# Patient Record
Sex: Female | Born: 1965 | Race: White | Hispanic: No | Marital: Single | Smoking: Current some day smoker
Health system: Southern US, Community
[De-identification: ages and names within clinical notes are randomized; demographics above are authoritative.]

## PROBLEM LIST (undated history)

## (undated) DIAGNOSIS — F419 Anxiety disorder, unspecified: Secondary | ICD-10-CM

## (undated) DIAGNOSIS — H269 Unspecified cataract: Secondary | ICD-10-CM

## (undated) DIAGNOSIS — G4700 Insomnia, unspecified: Secondary | ICD-10-CM

## (undated) DIAGNOSIS — N200 Calculus of kidney: Secondary | ICD-10-CM

## (undated) HISTORY — PX: TUBAL LIGATION: SHX77

## (undated) HISTORY — DX: Insomnia, unspecified: G47.00

## (undated) HISTORY — DX: Calculus of kidney: N20.0

## (undated) HISTORY — DX: Anxiety disorder, unspecified: F41.9

## (undated) HISTORY — DX: Unspecified cataract: H26.9

---

## 1997-09-04 ENCOUNTER — Emergency Department (HOSPITAL_COMMUNITY): Admission: EM | Admit: 1997-09-04 | Discharge: 1997-09-04 | Payer: Self-pay | Admitting: Emergency Medicine

## 2007-02-16 ENCOUNTER — Ambulatory Visit: Payer: Self-pay | Admitting: Gynecology

## 2007-02-16 ENCOUNTER — Encounter (INDEPENDENT_AMBULATORY_CARE_PROVIDER_SITE_OTHER): Payer: Self-pay | Admitting: Gynecology

## 2012-07-01 ENCOUNTER — Encounter: Payer: Self-pay | Admitting: Obstetrics and Gynecology

## 2012-07-01 ENCOUNTER — Ambulatory Visit (INDEPENDENT_AMBULATORY_CARE_PROVIDER_SITE_OTHER): Payer: Self-pay | Admitting: Obstetrics and Gynecology

## 2012-07-01 VITALS — BP 105/63 | HR 79 | Ht 68.0 in | Wt 154.0 lb

## 2012-07-01 DIAGNOSIS — Z01419 Encounter for gynecological examination (general) (routine) without abnormal findings: Secondary | ICD-10-CM

## 2012-07-01 DIAGNOSIS — Z124 Encounter for screening for malignant neoplasm of cervix: Secondary | ICD-10-CM

## 2012-07-01 MED ORDER — ZOLPIDEM TARTRATE 10 MG PO TABS: 5.0000 mg | ORAL_TABLET | Freq: Every evening | ORAL | Status: DC | PRN

## 2012-07-01 NOTE — Progress Notes (Signed)
  Subjective:     Brittney Howell is a 47 y.o. female G3P3 postmenopausal x 5 years with BMi 23 who is here for a comprehensive physical exam. The patient reports problems related with anxiety and insomina. Patient admits to being under a lot of stress lately. Was taking melatonin but it it no longer helping as a sleeping agent.  History   Social History  . Marital Status: Single    Spouse Name: N/A    Number of Children: N/A  . Years of Education: N/A   Occupational History  . Not on file.   Social History Main Topics  . Smoking status: Current Every Day Smoker -- 0.50 packs/day    Types: Cigarettes  . Smokeless tobacco: Not on file  . Alcohol Use: 0.5 oz/week    1 drink(s) per week  . Drug Use: No  . Sexually Active: Yes   Other Topics Concern  . Not on file   Social History Narrative  . No narrative on file   Health Maintenance  Topic Date Due  . Influenza Vaccine  12/29/1963  . Pap Smear  06/03/1981  . Tetanus/tdap  06/03/1982       Review of Systems A comprehensive review of systems was negative.   Objective:      GENERAL: Well-developed, well-nourished female in no acute distress.  HEENT: Normocephalic, atraumatic. Sclerae anicteric.  NECK: Supple. Normal thyroid.  LUNGS: Clear to auscultation bilaterally.  HEART: Regular rate and rhythm. BREASTS: Symmetric in size. No palpable masses or lymphadenopathy, skin changes, or nipple drainage. ABDOMEN: Soft, nontender, nondistended. No organomegaly. PELVIC: Normal external female genitalia. Vagina is pink and rugated.  Normal discharge. Normal appearing cervix. Uterus is normal in size. No adnexal mass or tenderness. EXTREMITIES: No cyanosis, clubbing, or edema, 2+ distal pulses.    Assessment:    Healthy female exam.      Plan:    Pap smear collected Referral for mammogram provided Ambien for sleep Referral to behavioral health for anxiety work up Patient advised to continue monthly breast and vulva  exam See After Visit Summary for Counseling Recommendations

## 2012-07-01 NOTE — Progress Notes (Signed)
Here today for yearly gyn physical.  Having trouble sleeping, is having panic attacks at least once a week x 2 months.  Patient is having stress in relationship and with her mom going thru divorce.

## 2012-07-01 NOTE — Patient Instructions (Signed)
Preventive Care for Adults, Female A healthy lifestyle and preventive care can promote health and wellness. Preventive health guidelines for women include the following key practices.  A routine yearly physical is a good way to check with your caregiver about your health and preventive screening. It is a chance to share any concerns and updates on your health, and to receive a thorough exam.  Visit your dentist for a routine exam and preventive care every 6 months. Brush your teeth twice a day and floss once a day. Good oral hygiene prevents tooth decay and gum disease.  The frequency of eye exams is based on your age, health, family medical history, use of contact lenses, and other factors. Follow your caregiver's recommendations for frequency of eye exams.  Eat a healthy diet. Foods like vegetables, fruits, whole grains, low-fat dairy products, and lean protein foods contain the nutrients you need without too many calories. Decrease your intake of foods high in solid fats, added sugars, and salt. Eat the right amount of calories for you.Get information about a proper diet from your caregiver, if necessary.  Regular physical exercise is one of the most important things you can do for your health. Most adults should get at least 150 minutes of moderate-intensity exercise (any activity that increases your heart rate and causes you to sweat) each week. In addition, most adults need muscle-strengthening exercises on 2 or more days a week.  Maintain a healthy weight. The body mass index (BMI) is a screening tool to identify possible weight problems. It provides an estimate of body fat based on height and weight. Your caregiver can help determine your BMI, and can help you achieve or maintain a healthy weight.For adults 20 years and older:  A BMI below 18.5 is considered underweight.  A BMI of 18.5 to 24.9 is normal.  A BMI of 25 to 29.9 is considered overweight.  A BMI of 30 and above is  considered obese.  Maintain normal blood lipids and cholesterol levels by exercising and minimizing your intake of saturated fat. Eat a balanced diet with plenty of fruit and vegetables. Blood tests for lipids and cholesterol should begin at age 20 and be repeated every 5 years. If your lipid or cholesterol levels are high, you are over 50, or you are at high risk for heart disease, you may need your cholesterol levels checked more frequently.Ongoing high lipid and cholesterol levels should be treated with medicines if diet and exercise are not effective.  If you smoke, find out from your caregiver how to quit. If you do not use tobacco, do not start.  If you are pregnant, do not drink alcohol. If you are breastfeeding, be very cautious about drinking alcohol. If you are not pregnant and choose to drink alcohol, do not exceed 1 drink per day. One drink is considered to be 12 ounces (355 mL) of beer, 5 ounces (148 mL) of wine, or 1.5 ounces (44 mL) of liquor.  Avoid use of street drugs. Do not share needles with anyone. Ask for help if you need support or instructions about stopping the use of drugs.  High blood pressure causes heart disease and increases the risk of stroke. Your blood pressure should be checked at least every 1 to 2 years. Ongoing high blood pressure should be treated with medicines if weight loss and exercise are not effective.  If you are 55 to 49 years old, ask your caregiver if you should take aspirin to prevent strokes.  Diabetes   screening involves taking a blood sample to check your fasting blood sugar level. This should be done once every 3 years, after age 45, if you are within normal weight and without risk factors for diabetes. Testing should be considered at a younger age or be carried out more frequently if you are overweight and have at least 1 risk factor for diabetes.  Breast cancer screening is essential preventive care for women. You should practice "breast  self-awareness." This means understanding the normal appearance and feel of your breasts and may include breast self-examination. Any changes detected, no matter how small, should be reported to a caregiver. Women in their 20s and 30s should have a clinical breast exam (CBE) by a caregiver as part of a regular health exam every 1 to 3 years. After age 40, women should have a CBE every year. Starting at age 40, women should consider having a mammography (breast X-ray test) every year. Women who have a family history of breast cancer should talk to their caregiver about genetic screening. Women at a high risk of breast cancer should talk to their caregivers about having magnetic resonance imaging (MRI) and a mammography every year.  The Pap test is a screening test for cervical cancer. A Pap test can show cell changes on the cervix that might become cervical cancer if left untreated. A Pap test is a procedure in which cells are obtained and examined from the lower end of the uterus (cervix).  Women should have a Pap test starting at age 21.  Between ages 21 and 29, Pap tests should be repeated every 2 years.  Beginning at age 30, you should have a Pap test every 3 years as long as the past 3 Pap tests have been normal.  Some women have medical problems that increase the chance of getting cervical cancer. Talk to your caregiver about these problems. It is especially important to talk to your caregiver if a new problem develops soon after your last Pap test. In these cases, your caregiver may recommend more frequent screening and Pap tests.  The above recommendations are the same for women who have or have not gotten the vaccine for human papillomavirus (HPV).  If you had a hysterectomy for a problem that was not cancer or a condition that could lead to cancer, then you no longer need Pap tests. Even if you no longer need a Pap test, a regular exam is a good idea to make sure no other problems are  starting.  If you are between ages 65 and 70, and you have had normal Pap tests going back 10 years, you no longer need Pap tests. Even if you no longer need a Pap test, a regular exam is a good idea to make sure no other problems are starting.  If you have had past treatment for cervical cancer or a condition that could lead to cancer, you need Pap tests and screening for cancer for at least 20 years after your treatment.  If Pap tests have been discontinued, risk factors (such as a new sexual partner) need to be reassessed to determine if screening should be resumed.  The HPV test is an additional test that may be used for cervical cancer screening. The HPV test looks for the virus that can cause the cell changes on the cervix. The cells collected during the Pap test can be tested for HPV. The HPV test could be used to screen women aged 30 years and older, and should   be used in women of any age who have unclear Pap test results. After the age of 30, women should have HPV testing at the same frequency as a Pap test.  Colorectal cancer can be detected and often prevented. Most routine colorectal cancer screening begins at the age of 50 and continues through age 75. However, your caregiver may recommend screening at an earlier age if you have risk factors for colon cancer. On a yearly basis, your caregiver may provide home test kits to check for hidden blood in the stool. Use of a small camera at the end of a tube, to directly examine the colon (sigmoidoscopy or colonoscopy), can detect the earliest forms of colorectal cancer. Talk to your caregiver about this at age 50, when routine screening begins. Direct examination of the colon should be repeated every 5 to 10 years through age 75, unless early forms of pre-cancerous polyps or small growths are found.  Hepatitis C blood testing is recommended for all people born from 1945 through 1965 and any individual with known risks for hepatitis C.  Practice  safe sex. Use condoms and avoid high-risk sexual practices to reduce the spread of sexually transmitted infections (STIs). STIs include gonorrhea, chlamydia, syphilis, trichomonas, herpes, HPV, and human immunodeficiency virus (HIV). Herpes, HIV, and HPV are viral illnesses that have no cure. They can result in disability, cancer, and death. Sexually active women aged 25 and younger should be checked for chlamydia. Older women with new or multiple partners should also be tested for chlamydia. Testing for other STIs is recommended if you are sexually active and at increased risk.  Osteoporosis is a disease in which the bones lose minerals and strength with aging. This can result in serious bone fractures. The risk of osteoporosis can be identified using a bone density scan. Women ages 65 and over and women at risk for fractures or osteoporosis should discuss screening with their caregivers. Ask your caregiver whether you should take a calcium supplement or vitamin D to reduce the rate of osteoporosis.  Menopause can be associated with physical symptoms and risks. Hormone replacement therapy is available to decrease symptoms and risks. You should talk to your caregiver about whether hormone replacement therapy is right for you.  Use sunscreen with sun protection factor (SPF) of 30 or more. Apply sunscreen liberally and repeatedly throughout the day. You should seek shade when your shadow is shorter than you. Protect yourself by wearing long sleeves, pants, a wide-brimmed hat, and sunglasses year round, whenever you are outdoors.  Once a month, do a whole body skin exam, using a mirror to look at the skin on your back. Notify your caregiver of new moles, moles that have irregular borders, moles that are larger than a pencil eraser, or moles that have changed in shape or color.  Stay current with required immunizations.  Influenza. You need a dose every fall (or winter). The composition of the flu vaccine  changes each year, so being vaccinated once is not enough.  Pneumococcal polysaccharide. You need 1 to 2 doses if you smoke cigarettes or if you have certain chronic medical conditions. You need 1 dose at age 65 (or older) if you have never been vaccinated.  Tetanus, diphtheria, pertussis (Tdap, Td). Get 1 dose of Tdap vaccine if you are younger than age 65, are over 65 and have contact with an infant, are a healthcare worker, are pregnant, or simply want to be protected from whooping cough. After that, you need a Td   booster dose every 10 years. Consult your caregiver if you have not had at least 3 tetanus and diphtheria-containing shots sometime in your life or have a deep or dirty wound.  HPV. You need this vaccine if you are a woman age 26 or younger. The vaccine is given in 3 doses over 6 months.  Measles, mumps, rubella (MMR). You need at least 1 dose of MMR if you were born in 1957 or later. You may also need a second dose.  Meningococcal. If you are age 19 to 21 and a first-year college student living in a residence hall, or have one of several medical conditions, you need to get vaccinated against meningococcal disease. You may also need additional booster doses.  Zoster (shingles). If you are age 60 or older, you should get this vaccine.  Varicella (chickenpox). If you have never had chickenpox or you were vaccinated but received only 1 dose, talk to your caregiver to find out if you need this vaccine.  Hepatitis A. You need this vaccine if you have a specific risk factor for hepatitis A virus infection or you simply wish to be protected from this disease. The vaccine is usually given as 2 doses, 6 to 18 months apart.  Hepatitis B. You need this vaccine if you have a specific risk factor for hepatitis B virus infection or you simply wish to be protected from this disease. The vaccine is given in 3 doses, usually over 6 months. Preventive Services / Frequency Ages 40 to 64  Blood  pressure check.** / Every 1 to 2 years.  Lipid and cholesterol check.** / Every 5 years beginning at age 20.  Clinical breast exam.** / Every year after age 40.  Mammogram.** / Every year beginning at age 40 and continuing for as long as you are in good health. Consult with your caregiver.  Pap test.** / Every 3 years starting at age 30 through age 65 or 70 with a history of 3 consecutive normal Pap tests.  HPV screening.** / Every 3 years from ages 30 through ages 65 to 70 with a history of 3 consecutive normal Pap tests.  Fecal occult blood test (FOBT) of stool. / Every year beginning at age 50 and continuing until age 75. You may not need to do this test if you get a colonoscopy every 10 years.  Flexible sigmoidoscopy or colonoscopy.** / Every 5 years for a flexible sigmoidoscopy or every 10 years for a colonoscopy beginning at age 50 and continuing until age 75.  Hepatitis C blood test.** / For all people born from 1945 through 1965 and any individual with known risks for hepatitis C.  Skin self-exam. / Monthly.  Influenza immunization.** / Every year.  Pneumococcal polysaccharide immunization.** / 1 to 2 doses if you smoke cigarettes or if you have certain chronic medical conditions.  Tetanus, diphtheria, pertussis (Tdap, Td) immunization.** / A one-time dose of Tdap vaccine. After that, you need a Td booster dose every 10 years.  Measles, mumps, rubella (MMR) immunization. / You need at least 1 dose of MMR if you were born in 1957 or later. You may also need a second dose.  Varicella immunization.** / Consult your caregiver.  Meningococcal immunization.** / Consult your caregiver.  Hepatitis A immunization.** / Consult your caregiver. 2 doses, 6 to 18 months apart.  Hepatitis B immunization.** / Consult your caregiver. 3 doses, usually over 6 months. ** Family history and personal history of risk and conditions may change your caregiver's recommendations.   Document Released:  06/11/2001 Document Revised: 07/08/2011 Document Reviewed: 09/10/2010 [employer] Patient Information 2013 [employer].  

## 2012-09-04 ENCOUNTER — Other Ambulatory Visit: Payer: Self-pay | Admitting: Obstetrics and Gynecology

## 2012-09-04 DIAGNOSIS — Z1231 Encounter for screening mammogram for malignant neoplasm of breast: Secondary | ICD-10-CM

## 2012-09-11 ENCOUNTER — Ambulatory Visit (HOSPITAL_COMMUNITY)
Admission: RE | Admit: 2012-09-11 | Discharge: 2012-09-11 | Disposition: A | Discharge status: Home / Self Care | Payer: Self-pay | Source: Ambulatory Visit | Attending: Obstetrics and Gynecology | Admitting: Obstetrics and Gynecology

## 2012-09-11 DIAGNOSIS — Z1231 Encounter for screening mammogram for malignant neoplasm of breast: Secondary | ICD-10-CM

## 2013-02-11 ENCOUNTER — Telehealth: Payer: Self-pay

## 2013-02-11 NOTE — Telephone Encounter (Signed)
Dr. Shawnie Pons this patient called wanting a refill on her Ambien she is out. She said you told her to call the office if she needed more. Please advice what to do,thanks! I don't think this med can be sent electronically, so let us know. Thanks

## 2013-02-12 ENCOUNTER — Telehealth: Payer: Self-pay

## 2013-02-12 NOTE — Telephone Encounter (Deleted)
Hi Dr. Shawnie Pons,  This patient has seen you before but on her last visit we had to put her with Dr.Cosntant because you were full.

## 2013-02-24 NOTE — Telephone Encounter (Signed)
Patient called requesting medication refill.  Spoke to Dr. Shawnie Pons. Meds authorized.

## 2013-05-13 ENCOUNTER — Telehealth: Payer: Self-pay | Admitting: *Deleted

## 2013-05-13 HISTORY — PX: KNEE SURGERY: SHX244

## 2013-05-13 NOTE — Telephone Encounter (Signed)
I do not see where we have ever given this to her.  Have I ever seen her?

## 2013-05-13 NOTE — Telephone Encounter (Signed)
Patient is calling to request a refill of her xanax.  She is just leaving the hospital at [facility] after falling and breaking her tibia and her left hand.  She is calling me from the hospital. She is not due for her physical until March.

## 2013-05-14 ENCOUNTER — Telehealth: Payer: Self-pay | Admitting: *Deleted

## 2013-05-14 NOTE — Telephone Encounter (Signed)
The pharmacist says that it was called in on 02-12-13 with two refills of #30.  Xanax 0.5mg .  She did fill the rx on the 02-12-13, 03-09-13, and 04-10-13. Dates.  She said that she has been taking it once a day at night to help calm her down enough to rest.  I can request her original chart from storage if you need me to.  I am wondering if her notes are not in the system due to the issue we had for those several months before epic began where we were not automatically receiving the dictations and had to call to have them faxed to [place].  I have seen this come up a couple of times in patients electronic charts.  There is a note on the 02-12-13 day regarding Ambien but nothing mentioning xanax.  Just let me know what you would like for me to do.

## 2013-05-14 NOTE — Telephone Encounter (Signed)
You saw her up until this last visit, when we were on paper charts.  She usually just gets it a couple times a year, not very often.  She is one that has been coming since we were in [place].  I think it might have gotten called in this last time when I was not in the office, but that was following her last physical, which was almost a year ago.

## 2013-05-14 NOTE — Telephone Encounter (Signed)
Who called it in on 10/14?  Why is it not documented?

## 2013-09-24 ENCOUNTER — Ambulatory Visit: Payer: Self-pay | Admitting: Family Medicine

## 2013-09-28 ENCOUNTER — Encounter: Payer: Self-pay | Admitting: Family Medicine

## 2013-09-28 ENCOUNTER — Ambulatory Visit (INDEPENDENT_AMBULATORY_CARE_PROVIDER_SITE_OTHER): Payer: Medicaid Other | Admitting: Family Medicine

## 2013-09-28 VITALS — BP 137/79 | HR 87 | Ht 67.0 in | Wt 151.0 lb

## 2013-09-28 DIAGNOSIS — F419 Anxiety disorder, unspecified: Secondary | ICD-10-CM | POA: Insufficient documentation

## 2013-09-28 DIAGNOSIS — F411 Generalized anxiety disorder: Secondary | ICD-10-CM

## 2013-09-28 DIAGNOSIS — G4700 Insomnia, unspecified: Secondary | ICD-10-CM | POA: Insufficient documentation

## 2013-09-28 DIAGNOSIS — Z1239 Encounter for other screening for malignant neoplasm of breast: Secondary | ICD-10-CM

## 2013-09-28 DIAGNOSIS — Z720 Tobacco use: Secondary | ICD-10-CM | POA: Insufficient documentation

## 2013-09-28 DIAGNOSIS — Z01419 Encounter for gynecological examination (general) (routine) without abnormal findings: Secondary | ICD-10-CM

## 2013-09-28 DIAGNOSIS — F17200 Nicotine dependence, unspecified, uncomplicated: Secondary | ICD-10-CM

## 2013-09-28 MED ORDER — ALPRAZOLAM 0.5 MG PO TABS: 0.5000 mg | ORAL_TABLET | Freq: Every evening | ORAL | Status: DC | PRN

## 2013-09-28 MED ORDER — ZOLPIDEM TARTRATE 10 MG PO TABS: 5.0000 mg | ORAL_TABLET | Freq: Every evening | ORAL | Status: DC | PRN

## 2013-09-28 NOTE — Progress Notes (Signed)
  Subjective:     Brittney Howell is a 48 y.o. female and is here for a comprehensive physical exam. The patient reports no problems. She fell in January and broke her tibial plateau.  remains on crutches.  She desires refill of Ambien and Xanax, which she uses 1 every other day or so for anxiety.  To see PCP soon. S/p BTL.  History   Social History  . Marital Status: Single    Spouse Name: N/A    Number of Children: N/A  . Years of Education: N/A   Occupational History  . Not on file.   Social History Main Topics  . Smoking status: Current Every Day Smoker -- 0.50 packs/day    Types: Cigarettes  . Smokeless tobacco: Never Used  . Alcohol Use: 0.5 oz/week    1 drink(s) per week  . Drug Use: No  . Sexual Activity: Yes   Other Topics Concern  . Not on file   Social History Narrative  . No narrative on file   Health Maintenance  Topic Date Due  . Tetanus/tdap  06/03/1982  . Mammogram  06/03/2013  . Colonoscopy  06/03/2013  . Influenza Vaccine  11/27/2013  . Pap Smear  07/02/2015    The following portions of the patient's history were reviewed and updated as appropriate: allergies, current medications, past family history, past medical history, past social history, past surgical history and problem list.  Review of Systems A comprehensive review of systems was negative.   Objective:    BP 137/79  Pulse 87  Ht 5\' 7"  (1.702 m)  Wt 151 lb (68.493 kg)  BMI 23.64 kg/m2 General appearance: alert, cooperative and appears stated age Head: Normocephalic, without obvious abnormality, atraumatic Neck: no adenopathy, supple, symmetrical, trachea midline and thyroid not enlarged, symmetric, no tenderness/mass/nodules Lungs: clear to auscultation bilaterally Breasts: normal appearance, no masses or tenderness Heart: regular rate and rhythm, S1, S2 normal, no murmur, click, rub or gallop Abdomen: soft, non-tender; bowel sounds normal; no masses,  no organomegaly Pelvic: cervix  normal in appearance, external genitalia normal, no adnexal masses or tenderness, no cervical motion tenderness, uterus normal size, shape, and consistency and vagina normal without discharge Extremities: extremities normal, atraumatic, no cyanosis or edema Pulses: 2+ and symmetric Skin: Skin color, texture, turgor normal. No rashes or lesions Lymph nodes: Cervical, supraclavicular, and axillary nodes normal. Neurologic: Grossly normal    Assessment:    Healthy female exam.      Plan:  Does not need pap smear Mammogram Annual Labs Med refill.   See After Visit Summary for Counseling Recommendations

## 2013-09-28 NOTE — Patient Instructions (Signed)
Preventive Care for Adults, Female A healthy lifestyle and preventive care can promote health and wellness. Preventive health guidelines for women include the following key practices.  A routine yearly physical is a good way to check with your health care provider about your health and preventive screening. It is a chance to share any concerns and updates on your health and to receive a thorough exam.  Visit your dentist for a routine exam and preventive care every 6 months. Brush your teeth twice a day and floss once a day. Good oral hygiene prevents tooth decay and gum disease.  The frequency of eye exams is based on your age, health, family medical history, use of contact lenses, and other factors. Follow your health care provider's recommendations for frequency of eye exams.  Eat a healthy diet. Foods like vegetables, fruits, whole grains, low-fat dairy products, and lean protein foods contain the nutrients you need without too many calories. Decrease your intake of foods high in solid fats, added sugars, and salt. Eat the right amount of calories for you.Get information about a proper diet from your health care provider, if necessary.  Regular physical exercise is one of the most important things you can do for your health. Most adults should get at least 150 minutes of moderate-intensity exercise (any activity that increases your heart rate and causes you to sweat) each week. In addition, most adults need muscle-strengthening exercises on 2 or more days a week.  Maintain a healthy weight. The body mass index (BMI) is a screening tool to identify possible weight problems. It provides an estimate of body fat based on height and weight. Your health care provider can find your BMI, and can help you achieve or maintain a healthy weight.For adults 20 years and older:  A BMI below 18.5 is considered underweight.  A BMI of 18.5 to 24.9 is normal.  A BMI of 25 to 29.9 is considered overweight.  A  BMI of 30 and above is considered obese.  Maintain normal blood lipids and cholesterol levels by exercising and minimizing your intake of saturated fat. Eat a balanced diet with plenty of fruit and vegetables. Blood tests for lipids and cholesterol should begin at age 62 and be repeated every 5 years. If your lipid or cholesterol levels are high, you are over 50, or you are at high risk for heart disease, you may need your cholesterol levels checked more frequently.Ongoing high lipid and cholesterol levels should be treated with medicines if diet and exercise are not working.  If you smoke, find out from your health care provider how to quit. If you do not use tobacco, do not start.  Lung cancer screening is recommended for adults aged 36 80 years who are at high risk for developing lung cancer because of a history of smoking. A yearly low-dose CT scan of the lungs is recommended for people who have at least a 30-pack-year history of smoking and are a current smoker or have quit within the past 15 years. A pack year of smoking is smoking an average of 1 pack of cigarettes a day for 1 year (for example: 1 pack a day for 30 years or 2 packs a day for 15 years). Yearly screening should continue until the smoker has stopped smoking for at least 15 years. Yearly screening should be stopped for people who develop a health problem that would prevent them from having lung cancer treatment.  If you are pregnant, do not drink alcohol. If you  are breastfeeding, be very cautious about drinking alcohol. If you are not pregnant and choose to drink alcohol, do not have more than 1 drink per day. One drink is considered to be 12 ounces (355 mL) of beer, 5 ounces (148 mL) of wine, or 1.5 ounces (44 mL) of liquor.  Avoid use of street drugs. Do not share needles with anyone. Ask for help if you need support or instructions about stopping the use of drugs.  High blood pressure causes heart disease and increases the risk  of stroke. Your blood pressure should be checked at least every 1 to 2 years. Ongoing high blood pressure should be treated with medicines if weight loss and exercise do not work.  If you are 39 50 years old, ask your health care provider if you should take aspirin to prevent strokes.  Diabetes screening involves taking a blood sample to check your fasting blood sugar level. This should be done once every 3 years, after age 56, if you are within normal weight and without risk factors for diabetes. Testing should be considered at a younger age or be carried out more frequently if you are overweight and have at least 1 risk factor for diabetes.  Breast cancer screening is essential preventive care for women. You should practice "breast self-awareness." This means understanding the normal appearance and feel of your breasts and may include breast self-examination. Any changes detected, no matter how small, should be reported to a health care provider. Women in their 40s and 30s should have a clinical breast exam (CBE) by a health care provider as part of a regular health exam every 1 to 3 years. After age 28, women should have a CBE every year. Starting at age 72, women should consider having a mammogram (breast X-ray test) every year. Women who have a family history of breast cancer should talk to their health care provider about genetic screening. Women at a high risk of breast cancer should talk to their health care providers about having an MRI and a mammogram every year.  Breast cancer gene (BRCA)-related cancer risk assessment is recommended for women who have family members with BRCA-related cancers. BRCA-related cancers include breast, ovarian, tubal, and peritoneal cancers. Having family members with these cancers may be associated with an increased risk for harmful changes (mutations) in the breast cancer genes BRCA1 and BRCA2. Results of the assessment will determine the need for genetic counseling  and BRCA1 and BRCA2 testing.  The Pap test is a screening test for cervical cancer. A Pap test can show cell changes on the cervix that might become cervical cancer if left untreated. A Pap test is a procedure in which cells are obtained and examined from the lower end of the uterus (cervix).  Women should have a Pap test starting at age 59.  Between ages 42 and 13, Pap tests should be repeated every 2 years.  Beginning at age 53, you should have a Pap test every 3 years as long as the past 3 Pap tests have been normal.  Some women have medical problems that increase the chance of getting cervical cancer. Talk to your health care provider about these problems. It is especially important to talk to your health care provider if a new problem develops soon after your last Pap test. In these cases, your health care provider may recommend more frequent screening and Pap tests.  The above recommendations are the same for women who have or have not gotten the vaccine  for human papillomavirus (HPV).  If you had a hysterectomy for a problem that was not cancer or a condition that could lead to cancer, then you no longer need Pap tests. Even if you no longer need a Pap test, a regular exam is a good idea to make sure no other problems are starting.  If you are between ages 58 and 10 years, and you have had normal Pap tests going back 10 years, you no longer need Pap tests. Even if you no longer need a Pap test, a regular exam is a good idea to make sure no other problems are starting.  If you have had past treatment for cervical cancer or a condition that could lead to cancer, you need Pap tests and screening for cancer for at least 20 years after your treatment.  If Pap tests have been discontinued, risk factors (such as a new sexual partner) need to be reassessed to determine if screening should be resumed.  The HPV test is an additional test that may be used for cervical cancer screening. The HPV test  looks for the virus that can cause the cell changes on the cervix. The cells collected during the Pap test can be tested for HPV. The HPV test could be used to screen women aged 67 years and older, and should be used in women of any age who have unclear Pap test results. After the age of 65, women should have HPV testing at the same frequency as a Pap test.  Colorectal cancer can be detected and often prevented. Most routine colorectal cancer screening begins at the age of 25 years and continues through age 66 years. However, your health care provider may recommend screening at an earlier age if you have risk factors for colon cancer. On a yearly basis, your health care provider may provide home test kits to check for hidden blood in the stool. Use of a small camera at the end of a tube, to directly examine the colon (sigmoidoscopy or colonoscopy), can detect the earliest forms of colorectal cancer. Talk to your health care provider about this at age 79, when routine screening begins. Direct exam of the colon should be repeated every 5 10 years through age 47 years, unless early forms of pre-cancerous polyps or small growths are found.  People who are at an increased risk for hepatitis B should be screened for this virus. You are considered at high risk for hepatitis B if:  You were born in a country where hepatitis B occurs often. Talk with your health care provider about which countries are considered high risk.  Your parents were born in a high-risk country and you have not received a shot to protect against hepatitis B (hepatitis B vaccine).  You have HIV or AIDS.  You use needles to inject street drugs.  You live with, or have sex with, someone who has Hepatitis B.  You get hemodialysis treatment.  You take certain medicines for conditions like cancer, organ transplantation, and autoimmune conditions.  Hepatitis C blood testing is recommended for all people born from [birth year] through 1965 and  any individual with known risks for hepatitis C.  Practice safe sex. Use condoms and avoid high-risk sexual practices to reduce the spread of sexually transmitted infections (STIs). STIs include gonorrhea, chlamydia, syphilis, trichomonas, herpes, HPV, and human immunodeficiency virus (HIV). Herpes, HIV, and HPV are viral illnesses that have no cure. They can result in disability, cancer, and death. Sexually active women aged 66  years and younger should be checked for chlamydia. Older women with new or multiple partners should also be tested for chlamydia. Testing for other STIs is recommended if you are sexually active and at increased risk.  Osteoporosis is a disease in which the bones lose minerals and strength with aging. This can result in serious bone fractures or breaks. The risk of osteoporosis can be identified using a bone density scan. Women ages 18 years and over and women at risk for fractures or osteoporosis should discuss screening with their health care providers. Ask your health care provider whether you should take a calcium supplement or vitamin D to reduce the rate of osteoporosis.  Menopause can be associated with physical symptoms and risks. Hormone replacement therapy is available to decrease symptoms and risks. You should talk to your health care provider about whether hormone replacement therapy is right for you.  Use sunscreen. Apply sunscreen liberally and repeatedly throughout the day. You should seek shade when your shadow is shorter than you. Protect yourself by wearing long sleeves, pants, a wide-brimmed hat, and sunglasses year round, whenever you are outdoors.  Once a month, do a whole body skin exam, using a mirror to look at the skin on your back. Tell your health care provider of new moles, moles that have irregular borders, moles that are larger than a pencil eraser, or moles that have changed in shape or color.  Stay current with required vaccines  (immunizations).  Influenza vaccine. All adults should be immunized every year.  Tetanus, diphtheria, and acellular pertussis (Td, Tdap) vaccine. Pregnant women should receive 1 dose of Tdap vaccine during each pregnancy. The dose should be obtained regardless of the length of time since the last dose. Immunization is preferred during the 27th 36th week of gestation. An adult who has not previously received Tdap or who does not know her vaccine status should receive 1 dose of Tdap. This initial dose should be followed by tetanus and diphtheria toxoids (Td) booster doses every 10 years. Adults with an unknown or incomplete history of completing a 3-dose immunization series with Td-containing vaccines should begin or complete a primary immunization series including a Tdap dose. Adults should receive a Td booster every 10 years.  Varicella vaccine. An adult without evidence of immunity to varicella should receive 2 doses or a second dose if she has previously received 1 dose. Pregnant females who do not have evidence of immunity should receive the first dose after pregnancy. This first dose should be obtained before leaving the health care facility. The second dose should be obtained 4 8 weeks after the first dose.  Human papillomavirus (HPV) vaccine. Females aged 9 26 years who have not received the vaccine previously should obtain the 3-dose series. The vaccine is not recommended for use in pregnant females. However, pregnancy testing is not needed before receiving a dose. If a female is found to be pregnant after receiving a dose, no treatment is needed. In that case, the remaining doses should be delayed until after the pregnancy. Immunization is recommended for any person with an immunocompromised condition through the age of 51 years if she did not get any or all doses earlier. During the 3-dose series, the second dose should be obtained 4 8 weeks after the first dose. The third dose should be obtained  24 weeks after the first dose and 16 weeks after the second dose.  Zoster vaccine. One dose is recommended for adults aged 57 years or older unless certain  conditions are present.  Measles, mumps, and rubella (MMR) vaccine. Adults born before [birth year] generally are considered immune to measles and mumps. Adults born in [birth year] or later should have 1 or more doses of MMR vaccine unless there is a contraindication to the vaccine or there is laboratory evidence of immunity to each of the three diseases. A routine second dose of MMR vaccine should be obtained at least 28 days after the first dose for students attending postsecondary schools, health care workers, or international travelers. People who received inactivated measles vaccine or an unknown type of measles vaccine during 1963 1967 should receive 2 doses of MMR vaccine. People who received inactivated mumps vaccine or an unknown type of mumps vaccine before 1979 and are at high risk for mumps infection should consider immunization with 2 doses of MMR vaccine. For females of childbearing age, rubella immunity should be determined. If there is no evidence of immunity, females who are not pregnant should be vaccinated. If there is no evidence of immunity, females who are pregnant should delay immunization until after pregnancy. Unvaccinated health care workers born before [birth year] who lack laboratory evidence of measles, mumps, or rubella immunity or laboratory confirmation of disease should consider measles and mumps immunization with 2 doses of MMR vaccine or rubella immunization with 1 dose of MMR vaccine.  Pneumococcal 13-valent conjugate (PCV13) vaccine. When indicated, a person who is uncertain of her immunization history and has no record of immunization should receive the PCV13 vaccine. An adult aged 42 years or older who has certain medical conditions and has not been previously immunized should receive 1 dose of PCV13 vaccine. This PCV13 should be followed  with a dose of pneumococcal polysaccharide (PPSV23) vaccine. The PPSV23 vaccine dose should be obtained at least 8 weeks after the dose of PCV13 vaccine. An adult aged 4 years or older who has certain medical conditions and previously received 1 or more doses of PPSV23 vaccine should receive 1 dose of PCV13. The PCV13 vaccine dose should be obtained 1 or more years after the last PPSV23 vaccine dose.  Pneumococcal polysaccharide (PPSV23) vaccine. When PCV13 is also indicated, PCV13 should be obtained first. All adults aged 27 years and older should be immunized. An adult younger than age 33 years who has certain medical conditions should be immunized. Any person who resides in a nursing home or long-term care facility should be immunized. An adult smoker should be immunized. People with an immunocompromised condition and certain other conditions should receive both PCV13 and PPSV23 vaccines. People with human immunodeficiency virus (HIV) infection should be immunized as soon as possible after diagnosis. Immunization during chemotherapy or radiation therapy should be avoided. Routine use of PPSV23 vaccine is not recommended for American Indians, [place] Natives, or people younger than 65 years unless there are medical conditions that require PPSV23 vaccine. When indicated, people who have unknown immunization and have no record of immunization should receive PPSV23 vaccine. One-time revaccination 5 years after the first dose of PPSV23 is recommended for people aged 13 64 years who have chronic kidney failure, nephrotic syndrome, asplenia, or immunocompromised conditions. People who received 1 2 doses of PPSV23 before age 66 years should receive another dose of PPSV23 vaccine at age 27 years or later if at least 5 years have passed since the previous dose. Doses of PPSV23 are not needed for people immunized with PPSV23 at or after age 33 years.  Meningococcal vaccine. Adults with asplenia or persistent complement  component deficiencies should receive 2  doses of quadrivalent meningococcal conjugate (MenACWY-D) vaccine. The doses should be obtained at least 2 months apart. Microbiologists working with certain meningococcal bacteria, [place] recruits, people at risk during an outbreak, and people who travel to or live in countries with a high rate of meningitis should be immunized. A first-year college student up through age 49 years who is living in a residence hall should receive a dose if she did not receive a dose on or after her 16th birthday. Adults who have certain high-risk conditions should receive one or more doses of vaccine.  Hepatitis A vaccine. Adults who wish to be protected from this disease, have certain high-risk conditions, work with hepatitis A-infected animals, work in hepatitis A research labs, or travel to or work in countries with a high rate of hepatitis A should be immunized. Adults who were previously unvaccinated and who anticipate close contact with an international adoptee during the first 60 days after arrival in the [place] from a country with a high rate of hepatitis A should be immunized.  Hepatitis B vaccine. Adults who wish to be protected from this disease, have certain high-risk conditions, may be exposed to blood or other infectious body fluids, are household contacts or sex partners of hepatitis B positive people, are clients or workers in certain care facilities, or travel to or work in countries with a high rate of hepatitis B should be immunized.  Haemophilus influenzae type b (Hib) vaccine. A previously unvaccinated person with asplenia or sickle cell disease or having a scheduled splenectomy should receive 1 dose of Hib vaccine. Regardless of previous immunization, a recipient of a hematopoietic stem cell transplant should receive a 3-dose series 6 12 months after her successful transplant. Hib vaccine is not recommended for adults with HIV infection. Preventive  Services / Frequency Ages 24 to 39years  Blood pressure check.** / Every 1 to 2 years.  Lipid and cholesterol check.** / Every 5 years beginning at age 66.  Clinical breast exam.** / Every 3 years for women in their 12s and 24s.  BRCA-related cancer risk assessment.** / For women who have family members with a BRCA-related cancer (breast, ovarian, tubal, or peritoneal cancers).  Pap test.** / Every 2 years from ages 31 through 69. Every 3 years starting at age 64 through age 76 or 89 with a history of 3 consecutive normal Pap tests.  HPV screening.** / Every 3 years from ages 10 through ages 10 to 96 with a history of 3 consecutive normal Pap tests.  Hepatitis C blood test.** / For any individual with known risks for hepatitis C.  Skin self-exam. / Monthly.  Influenza vaccine. / Every year.  Tetanus, diphtheria, and acellular pertussis (Tdap, Td) vaccine.** / Consult your health care provider. Pregnant women should receive 1 dose of Tdap vaccine during each pregnancy. 1 dose of Td every 10 years.  Varicella vaccine.** / Consult your health care provider. Pregnant females who do not have evidence of immunity should receive the first dose after pregnancy.  HPV vaccine. / 3 doses over 6 months, if 90 and younger. The vaccine is not recommended for use in pregnant females. However, pregnancy testing is not needed before receiving a dose.  Measles, mumps, rubella (MMR) vaccine.** / You need at least 1 dose of MMR if you were born in 1957 or later. You may also need a 2nd dose. For females of childbearing age, rubella immunity should be determined. If there is no evidence of immunity, females who are not  pregnant should be vaccinated. If there is no evidence of immunity, females who are pregnant should delay immunization until after pregnancy.  Pneumococcal 13-valent conjugate (PCV13) vaccine.** / Consult your health care provider.  Pneumococcal polysaccharide (PPSV23) vaccine.** / 1 to 2  doses if you smoke cigarettes or if you have certain conditions.  Meningococcal vaccine.** / 1 dose if you are age 88 to 6 years and a Market researcher living in a residence hall, or have one of several medical conditions, you need to get vaccinated against meningococcal disease. You may also need additional booster doses.  Hepatitis A vaccine.** / Consult your health care provider.  Hepatitis B vaccine.** / Consult your health care provider.  Haemophilus influenzae type b (Hib) vaccine.** / Consult your health care provider. Ages 23 to 64years  Blood pressure check.** / Every 1 to 2 years.  Lipid and cholesterol check.** / Every 5 years beginning at age 20 years.  Lung cancer screening. / Every year if you are aged 51 80 years and have a 30-pack-year history of smoking and currently smoke or have quit within the past 15 years. Yearly screening is stopped once you have quit smoking for at least 15 years or develop a health problem that would prevent you from having lung cancer treatment.  Clinical breast exam.** / Every year after age 8 years.  BRCA-related cancer risk assessment.** / For women who have family members with a BRCA-related cancer (breast, ovarian, tubal, or peritoneal cancers).  Mammogram.** / Every year beginning at age 10 years and continuing for as long as you are in good health. Consult with your health care provider.  Pap test.** / Every 3 years starting at age 30 years through age 5 or 61 years with a history of 3 consecutive normal Pap tests.  HPV screening.** / Every 3 years from ages 39 years through ages 72 to 19 years with a history of 3 consecutive normal Pap tests.  Fecal occult blood test (FOBT) of stool. / Every year beginning at age 59 years and continuing until age 27 years. You may not need to do this test if you get a colonoscopy every 10 years.  Flexible sigmoidoscopy or colonoscopy.** / Every 5 years for a flexible sigmoidoscopy or every  10 years for a colonoscopy beginning at age 110 years and continuing until age 63 years.  Hepatitis C blood test.** / For all people born from [birth year] through 1967 and any individual with known risks for hepatitis C.  Skin self-exam. / Monthly.  Influenza vaccine. / Every year.  Tetanus, diphtheria, and acellular pertussis (Tdap/Td) vaccine.** / Consult your health care provider. Pregnant women should receive 1 dose of Tdap vaccine during each pregnancy. 1 dose of Td every 10 years.  Varicella vaccine.** / Consult your health care provider. Pregnant females who do not have evidence of immunity should receive the first dose after pregnancy.  Zoster vaccine.** / 1 dose for adults aged 46 years or older.  Measles, mumps, rubella (MMR) vaccine.** / You need at least 1 dose of MMR if you were born in 1959 or later. You may also need a 2nd dose. For females of childbearing age, rubella immunity should be determined. If there is no evidence of immunity, females who are not pregnant should be vaccinated. If there is no evidence of immunity, females who are pregnant should delay immunization until after pregnancy.  Pneumococcal 13-valent conjugate (PCV13) vaccine.** / Consult your health care provider.  Pneumococcal polysaccharide (PPSV23) vaccine.** / 1  to 2 doses if you smoke cigarettes or if you have certain conditions.  Meningococcal vaccine.** / Consult your health care provider.  Hepatitis A vaccine.** / Consult your health care provider.  Hepatitis B vaccine.** / Consult your health care provider.  Haemophilus influenzae type b (Hib) vaccine.** / Consult your health care provider. Ages 34 years and over  Blood pressure check.** / Every 1 to 2 years.  Lipid and cholesterol check.** / Every 5 years beginning at age 57 years.  Lung cancer screening. / Every year if you are aged 59 80 years and have a 30-pack-year history of smoking and currently smoke or have quit within the past 15 years.  Yearly screening is stopped once you have quit smoking for at least 15 years or develop a health problem that would prevent you from having lung cancer treatment.  Clinical breast exam.** / Every year after age 37 years.  BRCA-related cancer risk assessment.** / For women who have family members with a BRCA-related cancer (breast, ovarian, tubal, or peritoneal cancers).  Mammogram.** / Every year beginning at age 44 years and continuing for as long as you are in good health. Consult with your health care provider.  Pap test.** / Every 3 years starting at age 49 years through age 50 or 66 years with 3 consecutive normal Pap tests. Testing can be stopped between 65 and 70 years with 3 consecutive normal Pap tests and no abnormal Pap or HPV tests in the past 10 years.  HPV screening.** / Every 3 years from ages 29 years through ages 57 or 75 years with a history of 3 consecutive normal Pap tests. Testing can be stopped between 65 and 70 years with 3 consecutive normal Pap tests and no abnormal Pap or HPV tests in the past 10 years.  Fecal occult blood test (FOBT) of stool. / Every year beginning at age 49 years and continuing until age 74 years. You may not need to do this test if you get a colonoscopy every 10 years.  Flexible sigmoidoscopy or colonoscopy.** / Every 5 years for a flexible sigmoidoscopy or every 10 years for a colonoscopy beginning at age 48 years and continuing until age 50 years.  Hepatitis C blood test.** / For all people born from [birth year] through 1967 and any individual with known risks for hepatitis C.  Osteoporosis screening.** / A one-time screening for women ages 72 years and over and women at risk for fractures or osteoporosis.  Skin self-exam. / Monthly.  Influenza vaccine. / Every year.  Tetanus, diphtheria, and acellular pertussis (Tdap/Td) vaccine.** / 1 dose of Td every 10 years.  Varicella vaccine.** / Consult your health care provider.  Zoster vaccine.** / 1  dose for adults aged 68 years or older.  Pneumococcal 13-valent conjugate (PCV13) vaccine.** / Consult your health care provider.  Pneumococcal polysaccharide (PPSV23) vaccine.** / 1 dose for all adults aged 39 years and older.  Meningococcal vaccine.** / Consult your health care provider.  Hepatitis A vaccine.** / Consult your health care provider.  Hepatitis B vaccine.** / Consult your health care provider.  Haemophilus influenzae type b (Hib) vaccine.** / Consult your health care provider. ** Family history and personal history of risk and conditions may change your health care provider's recommendations. Document Released: 06/11/2001 Document Revised: 02/03/2013 Document Reviewed: 09/10/2010 [facility] Patient Information 2014 [place], [place]. Insomnia Insomnia is frequent trouble falling and/or staying asleep. Insomnia can be a long term problem or a short term problem. Both are common. Insomnia  can be a short term problem when the wakefulness is related to a certain stress or worry. Long term insomnia is often related to ongoing stress during waking hours and/or poor sleeping habits. Overtime, sleep deprivation itself can make the problem worse. Every little thing feels more severe because you are overtired and your ability to cope is decreased. CAUSES   Stress, anxiety, and depression.  Poor sleeping habits.  Distractions such as TV in the bedroom.  Naps close to bedtime.  Engaging in emotionally charged conversations before bed.  Technical reading before sleep.  Alcohol and other sedatives. They may make the problem worse. They can hurt normal sleep patterns and normal dream activity.  Stimulants such as caffeine for several hours prior to bedtime.  Pain syndromes and shortness of breath can cause insomnia.  Exercise late at night.  Changing time zones may cause sleeping problems (jet lag). It is sometimes helpful to have someone observe your sleeping patterns. They  should look for periods of not breathing during the night (sleep apnea). They should also look to see how long those periods last. If you live alone or observers are uncertain, you can also be observed at a sleep clinic where your sleep patterns will be professionally monitored. Sleep apnea requires a checkup and treatment. Give your caregivers your medical history. Give your caregivers observations your family has made about your sleep.  SYMPTOMS   Not feeling rested in the morning.  Anxiety and restlessness at bedtime.  Difficulty falling and staying asleep. TREATMENT   Your caregiver may prescribe treatment for an underlying medical disorders. Your caregiver can give advice or help if you are using alcohol or other drugs for self-medication. Treatment of underlying problems will usually eliminate insomnia problems.  Medications can be prescribed for short time use. They are generally not recommended for lengthy use.  Over-the-counter sleep medicines are not recommended for lengthy use. They can be habit forming.  You can promote easier sleeping by making lifestyle changes such as:  Using relaxation techniques that help with breathing and reduce muscle tension.  Exercising earlier in the day.  Changing your diet and the time of your last meal. No night time snacks.  Establish a regular time to go to bed.  Counseling can help with stressful problems and worry.  Soothing music and white noise may be helpful if there are background noises you cannot remove.  Stop tedious detailed work at least one hour before bedtime. HOME CARE INSTRUCTIONS   Keep a diary. Inform your caregiver about your progress. This includes any medication side effects. See your caregiver regularly. Take note of:  Times when you are asleep.  Times when you are awake during the night.  The quality of your sleep.  How you feel the next day. This information will help your caregiver care for you.  Get out  of bed if you are still awake after 15 minutes. Read or do some quiet activity. Keep the lights down. Wait until you feel sleepy and go back to bed.  Keep regular sleeping and waking hours. Avoid naps.  Exercise regularly.  Avoid distractions at bedtime. Distractions include watching television or engaging in any intense or detailed activity like attempting to balance the household checkbook.  Develop a bedtime ritual. Keep a familiar routine of bathing, brushing your teeth, climbing into bed at the same time each night, listening to soothing music. Routines increase the success of falling to sleep faster.  Use relaxation techniques. This can be using  breathing and muscle tension release routines. It can also include visualizing peaceful scenes. You can also help control troubling or intruding thoughts by keeping your mind occupied with boring or repetitive thoughts like the old concept of counting sheep. You can make it more creative like imagining planting one beautiful flower after another in your backyard garden.  During your day, work to eliminate stress. When this is not possible use some of the previous suggestions to help reduce the anxiety that accompanies stressful situations. MAKE SURE YOU:   Understand these instructions.  Will watch your condition.  Will get help right away if you are not doing well or get worse. Document Released: 04/12/2000 Document Revised: 07/08/2011 Document Reviewed: 05/13/2007 [facility] Patient Information 2014 [place].

## 2013-09-29 ENCOUNTER — Telehealth: Payer: Self-pay | Admitting: *Deleted

## 2013-09-29 LAB — COMPREHENSIVE METABOLIC PANEL
ALT: 11 U/L (ref 0–35)
AST: 13 U/L (ref 0–37)
Albumin: 4.5 g/dL (ref 3.5–5.2)
Alkaline Phosphatase: 87 U/L (ref 39–117)
BILIRUBIN TOTAL: 0.4 mg/dL (ref 0.2–1.2)
BUN: 10 mg/dL (ref 6–23)
CHLORIDE: 104 meq/L (ref 96–112)
CO2: 27 mEq/L (ref 19–32)
CREATININE: 0.6 mg/dL (ref 0.50–1.10)
Calcium: 10.1 mg/dL (ref 8.4–10.5)
Glucose, Bld: 80 mg/dL (ref 70–99)
Potassium: 3.9 mEq/L (ref 3.5–5.3)
Sodium: 136 mEq/L (ref 135–145)
Total Protein: 7.2 g/dL (ref 6.0–8.3)

## 2013-09-29 LAB — LIPID PANEL
CHOL/HDL RATIO: 3 ratio
Cholesterol: 177 mg/dL (ref 0–200)
HDL: 59 mg/dL (ref >39)
LDL CALC: 92 mg/dL (ref 0–99)
Triglycerides: 129 mg/dL (ref <150)
VLDL: 26 mg/dL (ref 0–40)

## 2013-09-29 LAB — CBC
HCT: 43.1 % (ref 36.0–46.0)
Hemoglobin: 14.6 g/dL (ref 12.0–15.0)
MCH: 29.3 pg (ref 26.0–34.0)
MCHC: 33.9 g/dL (ref 30.0–36.0)
MCV: 86.4 fL (ref 78.0–100.0)
PLATELETS: 254 10*3/uL (ref 150–400)
RBC: 4.99 MIL/uL (ref 3.87–5.11)
RDW: 15 % (ref 11.5–15.5)
WBC: 7.3 10*3/uL (ref 4.0–10.5)

## 2013-09-29 LAB — TSH: TSH: 1.602 u[IU]/mL (ref 0.350–4.500)

## 2013-09-29 NOTE — Telephone Encounter (Signed)
Message copied by Gerri Spore on Wed Sep 29, 2013  1:29 PM ------      Message from: Brittney Howell      Created: Wed Sep 29, 2013  8:59 AM       normal labs please inform pt. ------

## 2013-09-29 NOTE — Telephone Encounter (Signed)
Pt aware of normal results.

## 2013-11-18 ENCOUNTER — Ambulatory Visit (HOSPITAL_COMMUNITY): Payer: Medicaid Other

## 2013-12-22 ENCOUNTER — Ambulatory Visit (HOSPITAL_COMMUNITY)
Admission: RE | Admit: 2013-12-22 | Discharge: 2013-12-22 | Disposition: A | Discharge status: Home / Self Care | Payer: Medicaid Other | Source: Ambulatory Visit | Attending: Family Medicine | Admitting: Family Medicine

## 2013-12-22 DIAGNOSIS — Z1231 Encounter for screening mammogram for malignant neoplasm of breast: Secondary | ICD-10-CM | POA: Insufficient documentation

## 2013-12-22 DIAGNOSIS — Z1239 Encounter for other screening for malignant neoplasm of breast: Secondary | ICD-10-CM

## 2014-02-28 ENCOUNTER — Encounter: Payer: Self-pay | Admitting: Family Medicine

## 2015-06-22 ENCOUNTER — Other Ambulatory Visit: Payer: Self-pay | Admitting: Family Medicine

## 2015-06-22 ENCOUNTER — Encounter: Payer: Self-pay | Admitting: Family Medicine

## 2015-06-22 ENCOUNTER — Ambulatory Visit (INDEPENDENT_AMBULATORY_CARE_PROVIDER_SITE_OTHER): Payer: BLUE CROSS/BLUE SHIELD | Admitting: Family Medicine

## 2015-06-22 VITALS — BP 116/73 | HR 79 | Resp 18 | Ht 67.0 in | Wt 158.0 lb

## 2015-06-22 DIAGNOSIS — Z1231 Encounter for screening mammogram for malignant neoplasm of breast: Secondary | ICD-10-CM

## 2015-06-22 DIAGNOSIS — F419 Anxiety disorder, unspecified: Secondary | ICD-10-CM | POA: Diagnosis not present

## 2015-06-22 DIAGNOSIS — Z1151 Encounter for screening for human papillomavirus (HPV): Secondary | ICD-10-CM | POA: Diagnosis not present

## 2015-06-22 DIAGNOSIS — Z124 Encounter for screening for malignant neoplasm of cervix: Secondary | ICD-10-CM

## 2015-06-22 DIAGNOSIS — Z01419 Encounter for gynecological examination (general) (routine) without abnormal findings: Secondary | ICD-10-CM

## 2015-06-22 DIAGNOSIS — G4700 Insomnia, unspecified: Secondary | ICD-10-CM

## 2015-06-22 DIAGNOSIS — R87810 Cervical high risk human papillomavirus (HPV) DNA test positive: Secondary | ICD-10-CM

## 2015-06-22 LAB — CBC
HEMATOCRIT: 43 % (ref 36.0–46.0)
Hemoglobin: 14.5 g/dL (ref 12.0–15.0)
MCH: 29.4 pg (ref 26.0–34.0)
MCHC: 33.7 g/dL (ref 30.0–36.0)
MCV: 87.2 fL (ref 78.0–100.0)
MPV: 9.6 fL (ref 8.6–12.4)
PLATELETS: 235 10*3/uL (ref 150–400)
RBC: 4.93 MIL/uL (ref 3.87–5.11)
RDW: 14.2 % (ref 11.5–15.5)
WBC: 5.9 10*3/uL (ref 4.0–10.5)

## 2015-06-22 LAB — COMPREHENSIVE METABOLIC PANEL
ALBUMIN: 4.6 g/dL (ref 3.6–5.1)
ALK PHOS: 78 U/L (ref 33–130)
ALT: 22 U/L (ref 6–29)
AST: 17 U/L (ref 10–35)
BILIRUBIN TOTAL: 0.4 mg/dL (ref 0.2–1.2)
BUN: 10 mg/dL (ref 7–25)
CO2: 25 mmol/L (ref 20–31)
CREATININE: 0.66 mg/dL (ref 0.50–1.05)
Calcium: 9.3 mg/dL (ref 8.6–10.4)
Chloride: 104 mmol/L (ref 98–110)
Glucose, Bld: 89 mg/dL (ref 65–99)
Potassium: 4.1 mmol/L (ref 3.5–5.3)
Sodium: 140 mmol/L (ref 135–146)
TOTAL PROTEIN: 7.3 g/dL (ref 6.1–8.1)

## 2015-06-22 LAB — LIPID PANEL
CHOLESTEROL: 189 mg/dL (ref 125–200)
HDL: 59 mg/dL (ref >=46)
LDL Cholesterol: 108 mg/dL (ref <130)
Total CHOL/HDL Ratio: 3.2 Ratio (ref <=5.0)
Triglycerides: 110 mg/dL (ref <150)
VLDL: 22 mg/dL (ref <30)

## 2015-06-22 LAB — TSH: TSH: 1 m[IU]/L

## 2015-06-22 MED ORDER — ALPRAZOLAM 0.5 MG PO TABS: 0.5000 mg | ORAL_TABLET | Freq: Every evening | ORAL | Status: DC | PRN

## 2015-06-22 MED ORDER — ZOLPIDEM TARTRATE 10 MG PO TABS: 5.0000 mg | ORAL_TABLET | Freq: Every evening | ORAL | Status: DC | PRN

## 2015-06-22 NOTE — Progress Notes (Signed)
  Subjective:     Brittney Howell is a 50 y.o. female and is here for a comprehensive physical exam. The patient reports no problems. Menopausal x 13 years.  No bleeding. Few hot flashes.  Social History   Social History  . Marital Status: Single    Spouse Name: N/A  . Number of Children: N/A  . Years of Education: N/A   Occupational History  . Not on file.   Social History Main Topics  . Smoking status: Current Every Day Smoker -- 0.25 packs/day    Types: Cigarettes  . Smokeless tobacco: Never Used  . Alcohol Use: 0.5 oz/week    1 Standard drinks or equivalent per week  . Drug Use: No  . Sexual Activity: Yes    Birth Control/ Protection: None   Other Topics Concern  . Not on file   Social History Narrative   Health Maintenance  Topic Date Due  . Hepatitis C Screening  Apr 27, 1966  . HIV Screening  06/03/1978  . TETANUS/TDAP  06/03/1982  . COLONOSCOPY  06/03/2013  . INFLUENZA VACCINE  11/28/2014  . PAP SMEAR  07/02/2015  . MAMMOGRAM  12/23/2015    The following portions of the patient's history were reviewed and updated as appropriate: allergies, current medications, past family history, past medical history, past social history, past surgical history and problem list.  Review of Systems Pertinent items noted in HPI and remainder of comprehensive ROS otherwise negative.   Objective:    BP 116/73 mmHg  Pulse 79  Resp 18  Ht 5\' 7"  (1.702 m)  Wt 158 lb (71.668 kg)  BMI 24.74 kg/m2 General appearance: alert, cooperative and appears stated age Head: Normocephalic, without obvious abnormality, atraumatic Neck: no adenopathy, supple, symmetrical, trachea midline and thyroid not enlarged, symmetric, no tenderness/mass/nodules Lungs: clear to auscultation bilaterally Breasts: normal appearance, no masses or tenderness Heart: regular rate and rhythm, S1, S2 normal, no murmur, click, rub or gallop Abdomen: soft, non-tender; bowel sounds normal; no masses,  no  organomegaly Pelvic: cervix normal in appearance, external genitalia normal, no adnexal masses or tenderness, no cervical motion tenderness, uterus normal size, shape, and consistency and atrophic vagina, dark brown birth mark right perineum Extremities: extremities normal, atraumatic, no cyanosis or edema Pulses: 2+ and symmetric Skin: Skin color, texture, turgor normal. No rashes or lesions Lymph nodes: Cervical, supraclavicular, and axillary nodes normal. Neurologic: Grossly normal    Assessment:    Healthy female exam.      Plan:     1. Screening for malignant neoplasm of cervix  - Cytology - PAP  2. Encounter for routine gynecological examination  - CBC - Comprehensive metabolic panel - TSH - Lipid panel - MM DIGITAL SCREENING BILATERAL; Future  3. Anxiety  - ALPRAZolam (XANAX) 0.5 MG tablet; Take 1 tablet (0.5 mg total) by mouth at bedtime as needed for anxiety.  Dispense: 45 tablet; Refill: 2  4. Insomnia  - zolpidem (AMBIEN) 10 MG tablet; Take 0.5 tablets (5 mg total) by mouth at bedtime as needed for sleep.  Dispense: 30 tablet; Refill: 3  See After Visit Summary for Counseling Recommendations

## 2015-06-22 NOTE — Patient Instructions (Signed)
Preventive Care for Adults, Female A healthy lifestyle and preventive care can promote health and wellness. Preventive health guidelines for women include the following key practices.  A routine yearly physical is a good way to check with your health care provider about your health and preventive screening. It is a chance to share any concerns and updates on your health and to receive a thorough exam.  Visit your dentist for a routine exam and preventive care every 6 months. Brush your teeth twice a day and floss once a day. Good oral hygiene prevents tooth decay and gum disease.  The frequency of eye exams is based on your age, health, family medical history, use of contact lenses, and other factors. Follow your health care provider's recommendations for frequency of eye exams.  Eat a healthy diet. Foods like vegetables, fruits, whole grains, low-fat dairy products, and lean protein foods contain the nutrients you need without too many calories. Decrease your intake of foods high in solid fats, added sugars, and salt. Eat the right amount of calories for you.Get information about a proper diet from your health care provider, if necessary.  Regular physical exercise is one of the most important things you can do for your health. Most adults should get at least 150 minutes of moderate-intensity exercise (any activity that increases your heart rate and causes you to sweat) each week. In addition, most adults need muscle-strengthening exercises on 2 or more days a week.  Maintain a healthy weight. The body mass index (BMI) is a screening tool to identify possible weight problems. It provides an estimate of body fat based on height and weight. Your health care provider can find your BMI and can help you achieve or maintain a healthy weight.For adults 20 years and older:  A BMI below 18.5 is considered underweight.  A BMI of 18.5 to 24.9 is normal.  A BMI of 25 to 29.9 is considered overweight.  A  BMI of 30 and above is considered obese.  Maintain normal blood lipids and cholesterol levels by exercising and minimizing your intake of saturated fat. Eat a balanced diet with plenty of fruit and vegetables. Blood tests for lipids and cholesterol should begin at age 57 and be repeated every 5 years. If your lipid or cholesterol levels are high, you are over 50, or you are at high risk for heart disease, you may need your cholesterol levels checked more frequently.Ongoing high lipid and cholesterol levels should be treated with medicines if diet and exercise are not working.  If you smoke, find out from your health care provider how to quit. If you do not use tobacco, do not start.  Lung cancer screening is recommended for adults aged 10-80 years who are at high risk for developing lung cancer because of a history of smoking. A yearly low-dose CT scan of the lungs is recommended for people who have at least a 30-pack-year history of smoking and are a current smoker or have quit within the past 15 years. A pack year of smoking is smoking an average of 1 pack of cigarettes a day for 1 year (for example: 1 pack a day for 30 years or 2 packs a day for 15 years). Yearly screening should continue until the smoker has stopped smoking for at least 15 years. Yearly screening should be stopped for people who develop a health problem that would prevent them from having lung cancer treatment.  If you are pregnant, do not drink alcohol. If you are  breastfeeding, be very cautious about drinking alcohol. If you are not pregnant and choose to drink alcohol, do not have more than 1 drink per day. One drink is considered to be 12 ounces (355 mL) of beer, 5 ounces (148 mL) of wine, or 1.5 ounces (44 mL) of liquor.  Avoid use of street drugs. Do not share needles with anyone. Ask for help if you need support or instructions about stopping the use of drugs.  High blood pressure causes heart disease and increases the risk  of stroke. Your blood pressure should be checked at least every 1 to 2 years. Ongoing high blood pressure should be treated with medicines if weight loss and exercise do not work.  If you are 55-79 years old, ask your health care provider if you should take aspirin to prevent strokes.  Diabetes screening is done by taking a blood sample to check your blood glucose level after you have not eaten for a certain period of time (fasting). If you are not overweight and you do not have risk factors for diabetes, you should be screened once every 3 years starting at age 45. If you are overweight or obese and you are 40-70 years of age, you should be screened for diabetes every year as part of your cardiovascular risk assessment.  Breast cancer screening is essential preventive care for women. You should practice "breast self-awareness." This means understanding the normal appearance and feel of your breasts and may include breast self-examination. Any changes detected, no matter how small, should be reported to a health care provider. Women in their 20s and 30s should have a clinical breast exam (CBE) by a health care provider as part of a regular health exam every 1 to 3 years. After age 40, women should have a CBE every year. Starting at age 40, women should consider having a mammogram (breast X-ray test) every year. Women who have a family history of breast cancer should talk to their health care provider about genetic screening. Women at a high risk of breast cancer should talk to their health care providers about having an MRI and a mammogram every year.  Breast cancer gene (BRCA)-related cancer risk assessment is recommended for women who have family members with BRCA-related cancers. BRCA-related cancers include breast, ovarian, tubal, and peritoneal cancers. Having family members with these cancers may be associated with an increased risk for harmful changes (mutations) in the breast cancer genes BRCA1 and  BRCA2. Results of the assessment will determine the need for genetic counseling and BRCA1 and BRCA2 testing.  Your health care provider may recommend that you be screened regularly for cancer of the pelvic organs (ovaries, uterus, and vagina). This screening involves a pelvic examination, including checking for microscopic changes to the surface of your cervix (Pap test). You may be encouraged to have this screening done every 3 years, beginning at age 21.  For women ages 30-65, health care providers may recommend pelvic exams and Pap testing every 3 years, or they may recommend the Pap and pelvic exam, combined with testing for human papilloma virus (HPV), every 5 years. Some types of HPV increase your risk of cervical cancer. Testing for HPV may also be done on women of any age with unclear Pap test results.  Other health care providers may not recommend any screening for nonpregnant women who are considered low risk for pelvic cancer and who do not have symptoms. Ask your health care provider if a screening pelvic exam is right for   you.  If you have had past treatment for cervical cancer or a condition that could lead to cancer, you need Pap tests and screening for cancer for at least 20 years after your treatment. If Pap tests have been discontinued, your risk factors (such as having a new sexual partner) need to be reassessed to determine if screening should resume. Some women have medical problems that increase the chance of getting cervical cancer. In these cases, your health care provider may recommend more frequent screening and Pap tests.  Colorectal cancer can be detected and often prevented. Most routine colorectal cancer screening begins at the age of 50 years and continues through age 75 years. However, your health care provider may recommend screening at an earlier age if you have risk factors for colon cancer. On a yearly basis, your health care provider may provide home test kits to check  for hidden blood in the stool. Use of a small camera at the end of a tube, to directly examine the colon (sigmoidoscopy or colonoscopy), can detect the earliest forms of colorectal cancer. Talk to your health care provider about this at age 50, when routine screening begins. Direct exam of the colon should be repeated every 5-10 years through age 75 years, unless early forms of precancerous polyps or small growths are found.  People who are at an increased risk for hepatitis B should be screened for this virus. You are considered at high risk for hepatitis B if:  You were born in a country where hepatitis B occurs often. Talk with your health care provider about which countries are considered high risk.  Your parents were born in a high-risk country and you have not received a shot to protect against hepatitis B (hepatitis B vaccine).  You have HIV or AIDS.  You use needles to inject street drugs.  You live with, or have sex with, someone who has hepatitis B.  You get hemodialysis treatment.  You take certain medicines for conditions like cancer, organ transplantation, and autoimmune conditions.  Hepatitis C blood testing is recommended for all people born from 1945 through 1965 and any individual with known risks for hepatitis C.  Practice safe sex. Use condoms and avoid high-risk sexual practices to reduce the spread of sexually transmitted infections (STIs). STIs include gonorrhea, chlamydia, syphilis, trichomonas, herpes, HPV, and human immunodeficiency virus (HIV). Herpes, HIV, and HPV are viral illnesses that have no cure. They can result in disability, cancer, and death.  You should be screened for sexually transmitted illnesses (STIs) including gonorrhea and chlamydia if:  You are sexually active and are younger than 24 years.  You are older than 24 years and your health care provider tells you that you are at risk for this type of infection.  Your sexual activity has changed  since you were last screened and you are at an increased risk for chlamydia or gonorrhea. Ask your health care provider if you are at risk.  If you are at risk of being infected with HIV, it is recommended that you take a prescription medicine daily to prevent HIV infection. This is called preexposure prophylaxis (PrEP). You are considered at risk if:  You are sexually active and do not regularly use condoms or know the HIV status of your partner(s).  You take drugs by injection.  You are sexually active with a partner who has HIV.  Talk with your health care provider about whether you are at high risk of being infected with HIV. If   you choose to begin PrEP, you should first be tested for HIV. You should then be tested every 3 months for as long as you are taking PrEP.  Osteoporosis is a disease in which the bones lose minerals and strength with aging. This can result in serious bone fractures or breaks. The risk of osteoporosis can be identified using a bone density scan. Women ages 67 years and over and women at risk for fractures or osteoporosis should discuss screening with their health care providers. Ask your health care provider whether you should take a calcium supplement or vitamin D to reduce the rate of osteoporosis.  Menopause can be associated with physical symptoms and risks. Hormone replacement therapy is available to decrease symptoms and risks. You should talk to your health care provider about whether hormone replacement therapy is right for you.  Use sunscreen. Apply sunscreen liberally and repeatedly throughout the day. You should seek shade when your shadow is shorter than you. Protect yourself by wearing long sleeves, pants, a wide-brimmed hat, and sunglasses year round, whenever you are outdoors.  Once a month, do a whole body skin exam, using a mirror to look at the skin on your back. Tell your health care provider of new moles, moles that have irregular borders, moles that  are larger than a pencil eraser, or moles that have changed in shape or color.  Stay current with required vaccines (immunizations).  Influenza vaccine. All adults should be immunized every year.  Tetanus, diphtheria, and acellular pertussis (Td, Tdap) vaccine. Pregnant women should receive 1 dose of Tdap vaccine during each pregnancy. The dose should be obtained regardless of the length of time since the last dose. Immunization is preferred during the 27th-36th week of gestation. An adult who has not previously received Tdap or who does not know her vaccine status should receive 1 dose of Tdap. This initial dose should be followed by tetanus and diphtheria toxoids (Td) booster doses every 10 years. Adults with an unknown or incomplete history of completing a 3-dose immunization series with Td-containing vaccines should begin or complete a primary immunization series including a Tdap dose. Adults should receive a Td booster every 10 years.  Varicella vaccine. An adult without evidence of immunity to varicella should receive 2 doses or a second dose if she has previously received 1 dose. Pregnant females who do not have evidence of immunity should receive the first dose after pregnancy. This first dose should be obtained before leaving the health care facility. The second dose should be obtained 4-8 weeks after the first dose.  Human papillomavirus (HPV) vaccine. Females aged 13-26 years who have not received the vaccine previously should obtain the 3-dose series. The vaccine is not recommended for use in pregnant females. However, pregnancy testing is not needed before receiving a dose. If a female is found to be pregnant after receiving a dose, no treatment is needed. In that case, the remaining doses should be delayed until after the pregnancy. Immunization is recommended for any person with an immunocompromised condition through the age of 61 years if she did not get any or all doses earlier. During the  3-dose series, the second dose should be obtained 4-8 weeks after the first dose. The third dose should be obtained 24 weeks after the first dose and 16 weeks after the second dose.  Zoster vaccine. One dose is recommended for adults aged 30 years or older unless certain conditions are present.  Measles, mumps, and rubella (MMR) vaccine. Adults born  before 1957 generally are considered immune to measles and mumps. Adults born in [birth year] or later should have 1 or more doses of MMR vaccine unless there is a contraindication to the vaccine or there is laboratory evidence of immunity to each of the three diseases. A routine second dose of MMR vaccine should be obtained at least 28 days after the first dose for students attending postsecondary schools, health care workers, or international travelers. People who received inactivated measles vaccine or an unknown type of measles vaccine during 1963-1967 should receive 2 doses of MMR vaccine. People who received inactivated mumps vaccine or an unknown type of mumps vaccine before 1979 and are at high risk for mumps infection should consider immunization with 2 doses of MMR vaccine. For females of childbearing age, rubella immunity should be determined. If there is no evidence of immunity, females who are not pregnant should be vaccinated. If there is no evidence of immunity, females who are pregnant should delay immunization until after pregnancy. Unvaccinated health care workers born before [birth year] who lack laboratory evidence of measles, mumps, or rubella immunity or laboratory confirmation of disease should consider measles and mumps immunization with 2 doses of MMR vaccine or rubella immunization with 1 dose of MMR vaccine.  Pneumococcal 13-valent conjugate (PCV13) vaccine. When indicated, a person who is uncertain of his immunization history and has no record of immunization should receive the PCV13 vaccine. All adults 60 years of age and older should receive this  vaccine. An adult aged 86 years or older who has certain medical conditions and has not been previously immunized should receive 1 dose of PCV13 vaccine. This PCV13 should be followed with a dose of pneumococcal polysaccharide (PPSV23) vaccine. Adults who are at high risk for pneumococcal disease should obtain the PPSV23 vaccine at least 8 weeks after the dose of PCV13 vaccine. Adults older than 52 years of age who have normal immune system function should obtain the PPSV23 vaccine dose at least 1 year after the dose of PCV13 vaccine.  Pneumococcal polysaccharide (PPSV23) vaccine. When PCV13 is also indicated, PCV13 should be obtained first. All adults aged 73 years and older should be immunized. An adult younger than age 104 years who has certain medical conditions should be immunized. Any person who resides in a nursing home or long-term care facility should be immunized. An adult smoker should be immunized. People with an immunocompromised condition and certain other conditions should receive both PCV13 and PPSV23 vaccines. People with human immunodeficiency virus (HIV) infection should be immunized as soon as possible after diagnosis. Immunization during chemotherapy or radiation therapy should be avoided. Routine use of PPSV23 vaccine is not recommended for American Indians, [place] Natives, or people younger than 65 years unless there are medical conditions that require PPSV23 vaccine. When indicated, people who have unknown immunization and have no record of immunization should receive PPSV23 vaccine. One-time revaccination 5 years after the first dose of PPSV23 is recommended for people aged 19-64 years who have chronic kidney failure, nephrotic syndrome, asplenia, or immunocompromised conditions. People who received 1-2 doses of PPSV23 before age 21 years should receive another dose of PPSV23 vaccine at age 75 years or later if at least 5 years have passed since the previous dose. Doses of PPSV23 are not  needed for people immunized with PPSV23 at or after age 21 years.  Meningococcal vaccine. Adults with asplenia or persistent complement component deficiencies should receive 2 doses of quadrivalent meningococcal conjugate (MenACWY-D) vaccine. The doses should be obtained  at least 2 months apart. Microbiologists working with certain meningococcal bacteria, [place] recruits, people at risk during an outbreak, and people who travel to or live in countries with a high rate of meningitis should be immunized. A first-year college student up through age 34 years who is living in a residence hall should receive a dose if she did not receive a dose on or after her 16th birthday. Adults who have certain high-risk conditions should receive one or more doses of vaccine.  Hepatitis A vaccine. Adults who wish to be protected from this disease, have certain high-risk conditions, work with hepatitis A-infected animals, work in hepatitis A research labs, or travel to or work in countries with a high rate of hepatitis A should be immunized. Adults who were previously unvaccinated and who anticipate close contact with an international adoptee during the first 60 days after arrival in the [place] from a country with a high rate of hepatitis A should be immunized.  Hepatitis B vaccine. Adults who wish to be protected from this disease, have certain high-risk conditions, may be exposed to blood or other infectious body fluids, are household contacts or sex partners of hepatitis B positive people, are clients or workers in certain care facilities, or travel to or work in countries with a high rate of hepatitis B should be immunized.  Haemophilus influenzae type b (Hib) vaccine. A previously unvaccinated person with asplenia or sickle cell disease or having a scheduled splenectomy should receive 1 dose of Hib vaccine. Regardless of previous immunization, a recipient of a hematopoietic stem cell transplant should receive a  3-dose series 6-12 months after her successful transplant. Hib vaccine is not recommended for adults with HIV infection. Preventive Services / Frequency Ages 35 to 4 years  Blood pressure check.** / Every 3-5 years.  Lipid and cholesterol check.** / Every 5 years beginning at age 60.  Clinical breast exam.** / Every 3 years for women in their 71s and 10s.  BRCA-related cancer risk assessment.** / For women who have family members with a BRCA-related cancer (breast, ovarian, tubal, or peritoneal cancers).  Pap test.** / Every 2 years from ages 76 through 26. Every 3 years starting at age 61 through age 76 or 93 with a history of 3 consecutive normal Pap tests.  HPV screening.** / Every 3 years from ages 37 through ages 60 to 51 with a history of 3 consecutive normal Pap tests.  Hepatitis C blood test.** / For any individual with known risks for hepatitis C.  Skin self-exam. / Monthly.  Influenza vaccine. / Every year.  Tetanus, diphtheria, and acellular pertussis (Tdap, Td) vaccine.** / Consult your health care provider. Pregnant women should receive 1 dose of Tdap vaccine during each pregnancy. 1 dose of Td every 10 years.  Varicella vaccine.** / Consult your health care provider. Pregnant females who do not have evidence of immunity should receive the first dose after pregnancy.  HPV vaccine. / 3 doses over 6 months, if 93 and younger. The vaccine is not recommended for use in pregnant females. However, pregnancy testing is not needed before receiving a dose.  Measles, mumps, rubella (MMR) vaccine.** / You need at least 1 dose of MMR if you were born in 1957 or later. You may also need a 2nd dose. For females of childbearing age, rubella immunity should be determined. If there is no evidence of immunity, females who are not pregnant should be vaccinated. If there is no evidence of immunity, females who are  pregnant should delay immunization until after pregnancy.  Pneumococcal  13-valent conjugate (PCV13) vaccine.** / Consult your health care provider.  Pneumococcal polysaccharide (PPSV23) vaccine.** / 1 to 2 doses if you smoke cigarettes or if you have certain conditions.  Meningococcal vaccine.** / 1 dose if you are age 62 to 22 years and a Market researcher living in a residence hall, or have one of several medical conditions, you need to get vaccinated against meningococcal disease. You may also need additional booster doses.  Hepatitis A vaccine.** / Consult your health care provider.  Hepatitis B vaccine.** / Consult your health care provider.  Haemophilus influenzae type b (Hib) vaccine.** / Consult your health care provider. Ages 49 to 28 years  Blood pressure check.** / Every year.  Lipid and cholesterol check.** / Every 5 years beginning at age 71 years.  Lung cancer screening. / Every year if you are aged 63-80 years and have a 30-pack-year history of smoking and currently smoke or have quit within the past 15 years. Yearly screening is stopped once you have quit smoking for at least 15 years or develop a health problem that would prevent you from having lung cancer treatment.  Clinical breast exam.** / Every year after age 22 years.  BRCA-related cancer risk assessment.** / For women who have family members with a BRCA-related cancer (breast, ovarian, tubal, or peritoneal cancers).  Mammogram.** / Every year beginning at age 75 years and continuing for as long as you are in good health. Consult with your health care provider.  Pap test.** / Every 3 years starting at age 60 years through age 34 or 37 years with a history of 3 consecutive normal Pap tests.  HPV screening.** / Every 3 years from ages 64 years through ages 37 to 81 years with a history of 3 consecutive normal Pap tests.  Fecal occult blood test (FOBT) of stool. / Every year beginning at age 56 years and continuing until age 42 years. You may not need to do this test if you get  a colonoscopy every 10 years.  Flexible sigmoidoscopy or colonoscopy.** / Every 5 years for a flexible sigmoidoscopy or every 10 years for a colonoscopy beginning at age 96 years and continuing until age 44 years.  Hepatitis C blood test.** / For all people born from [birth year] through 1965 and any individual with known risks for hepatitis C.  Skin self-exam. / Monthly.  Influenza vaccine. / Every year.  Tetanus, diphtheria, and acellular pertussis (Tdap/Td) vaccine.** / Consult your health care provider. Pregnant women should receive 1 dose of Tdap vaccine during each pregnancy. 1 dose of Td every 10 years.  Varicella vaccine.** / Consult your health care provider. Pregnant females who do not have evidence of immunity should receive the first dose after pregnancy.  Zoster vaccine.** / 1 dose for adults aged 56 years or older.  Measles, mumps, rubella (MMR) vaccine.** / You need at least 1 dose of MMR if you were born in 1957 or later. You may also need a second dose. For females of childbearing age, rubella immunity should be determined. If there is no evidence of immunity, females who are not pregnant should be vaccinated. If there is no evidence of immunity, females who are pregnant should delay immunization until after pregnancy.  Pneumococcal 13-valent conjugate (PCV13) vaccine.** / Consult your health care provider.  Pneumococcal polysaccharide (PPSV23) vaccine.** / 1 to 2 doses if you smoke cigarettes or if you have certain conditions.  Meningococcal vaccine.** /  Consult your health care provider.  Hepatitis A vaccine.** / Consult your health care provider.  Hepatitis B vaccine.** / Consult your health care provider.  Haemophilus influenzae type b (Hib) vaccine.** / Consult your health care provider. Ages 48 years and over  Blood pressure check.** / Every year.  Lipid and cholesterol check.** / Every 5 years beginning at age 30 years.  Lung cancer screening. / Every year if you  are aged 15-80 years and have a 30-pack-year history of smoking and currently smoke or have quit within the past 15 years. Yearly screening is stopped once you have quit smoking for at least 15 years or develop a health problem that would prevent you from having lung cancer treatment.  Clinical breast exam.** / Every year after age 83 years.  BRCA-related cancer risk assessment.** / For women who have family members with a BRCA-related cancer (breast, ovarian, tubal, or peritoneal cancers).  Mammogram.** / Every year beginning at age 55 years and continuing for as long as you are in good health. Consult with your health care provider.  Pap test.** / Every 3 years starting at age 56 years through age 22 or 64 years with 3 consecutive normal Pap tests. Testing can be stopped between 65 and 70 years with 3 consecutive normal Pap tests and no abnormal Pap or HPV tests in the past 10 years.  HPV screening.** / Every 3 years from ages 39 years through ages 76 or 3 years with a history of 3 consecutive normal Pap tests. Testing can be stopped between 65 and 70 years with 3 consecutive normal Pap tests and no abnormal Pap or HPV tests in the past 10 years.  Fecal occult blood test (FOBT) of stool. / Every year beginning at age 10 years and continuing until age 50 years. You may not need to do this test if you get a colonoscopy every 10 years.  Flexible sigmoidoscopy or colonoscopy.** / Every 5 years for a flexible sigmoidoscopy or every 10 years for a colonoscopy beginning at age 47 years and continuing until age 57 years.  Hepatitis C blood test.** / For all people born from [birth year] through 1967 and any individual with known risks for hepatitis C.  Osteoporosis screening.** / A one-time screening for women ages 67 years and over and women at risk for fractures or osteoporosis.  Skin self-exam. / Monthly.  Influenza vaccine. / Every year.  Tetanus, diphtheria, and acellular pertussis (Tdap/Td)  vaccine.** / 1 dose of Td every 10 years.  Varicella vaccine.** / Consult your health care provider.  Zoster vaccine.** / 1 dose for adults aged 70 years or older.  Pneumococcal 13-valent conjugate (PCV13) vaccine.** / Consult your health care provider.  Pneumococcal polysaccharide (PPSV23) vaccine.** / 1 dose for all adults aged 61 years and older.  Meningococcal vaccine.** / Consult your health care provider.  Hepatitis A vaccine.** / Consult your health care provider.  Hepatitis B vaccine.** / Consult your health care provider.  Haemophilus influenzae type b (Hib) vaccine.** / Consult your health care provider. ** Family history and personal history of risk and conditions may change your health care provider's recommendations.   This information is not intended to replace advice given to you by your health care provider. Make sure you discuss any questions you have with your health care provider.   Document Released: 06/11/2001 Document Revised: 05/06/2014 Document Reviewed: 09/10/2010 Elsevier Interactive Patient Education [employer].

## 2015-06-23 ENCOUNTER — Telehealth: Payer: Self-pay | Admitting: *Deleted

## 2015-06-23 NOTE — Telephone Encounter (Signed)
-----   Message from [name], MD sent at 06/23/2015  8:07 AM EST ----- Labs are normal-inform pt

## 2015-06-23 NOTE — Telephone Encounter (Signed)
Called pt, informed her of normal lab results.

## 2015-06-27 LAB — CYTOLOGY - PAP

## 2015-07-04 ENCOUNTER — Ambulatory Visit: Payer: BLUE CROSS/BLUE SHIELD

## 2015-07-04 ENCOUNTER — Encounter: Payer: Self-pay | Admitting: Family Medicine

## 2015-07-04 DIAGNOSIS — R87810 Cervical high risk human papillomavirus (HPV) DNA test positive: Secondary | ICD-10-CM

## 2015-07-04 DIAGNOSIS — R87610 Atypical squamous cells of undetermined significance on cytologic smear of cervix (ASC-US): Secondary | ICD-10-CM | POA: Insufficient documentation

## 2015-10-17 ENCOUNTER — Ambulatory Visit: Payer: BLUE CROSS/BLUE SHIELD

## 2015-10-18 ENCOUNTER — Ambulatory Visit
Admission: RE | Admit: 2015-10-18 | Discharge: 2015-10-18 | Disposition: A | Discharge status: Home / Self Care | Payer: BLUE CROSS/BLUE SHIELD | Source: Ambulatory Visit | Attending: Family Medicine | Admitting: Family Medicine

## 2015-10-18 DIAGNOSIS — Z1231 Encounter for screening mammogram for malignant neoplasm of breast: Secondary | ICD-10-CM

## 2016-01-03 ENCOUNTER — Telehealth: Payer: Self-pay | Admitting: *Deleted

## 2016-01-03 NOTE — Telephone Encounter (Signed)
Spoke to pt about refill request. Advised pt that we are not able to refill at this time and that she will need to establish care with PCP as they are the ones that really need to be monitoring this type of medication. She asked what she could use to help with insomnia. I suggested melatonin for the time being. She expressed that only works for about 2 weeks then stops working but that she would try to find a PCP in the mean time until her next annual which should be some time in February 2018.
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
## 2016-06-25 ENCOUNTER — Ambulatory Visit: Payer: BLUE CROSS/BLUE SHIELD | Admitting: Family Medicine

## 2016-07-03 ENCOUNTER — Ambulatory Visit (INDEPENDENT_AMBULATORY_CARE_PROVIDER_SITE_OTHER): Payer: BLUE CROSS/BLUE SHIELD | Admitting: Family Medicine

## 2016-07-03 ENCOUNTER — Encounter: Payer: Self-pay | Admitting: Family Medicine

## 2016-07-03 VITALS — BP 131/72 | HR 78 | Wt 160.8 lb

## 2016-07-03 DIAGNOSIS — Z01411 Encounter for gynecological examination (general) (routine) with abnormal findings: Secondary | ICD-10-CM

## 2016-07-03 DIAGNOSIS — F419 Anxiety disorder, unspecified: Secondary | ICD-10-CM

## 2016-07-03 DIAGNOSIS — Z0000 Encounter for general adult medical examination without abnormal findings: Secondary | ICD-10-CM | POA: Diagnosis not present

## 2016-07-03 DIAGNOSIS — Z01419 Encounter for gynecological examination (general) (routine) without abnormal findings: Secondary | ICD-10-CM | POA: Diagnosis not present

## 2016-07-03 DIAGNOSIS — G4709 Other insomnia: Secondary | ICD-10-CM

## 2016-07-03 DIAGNOSIS — Z124 Encounter for screening for malignant neoplasm of cervix: Secondary | ICD-10-CM

## 2016-07-03 MED ORDER — ZOLPIDEM TARTRATE 10 MG PO TABS: 5.0000 mg | ORAL_TABLET | Freq: Every evening | ORAL | 3 refills | Status: DC | PRN

## 2016-07-03 MED ORDER — ALPRAZOLAM 0.5 MG PO TABS: 0.5000 mg | ORAL_TABLET | Freq: Every evening | ORAL | 2 refills | Status: DC | PRN

## 2016-07-03 NOTE — Patient Instructions (Signed)

## 2016-07-03 NOTE — Progress Notes (Signed)
   Subjective:     Brittney Howell is a 51 y.o. female and is here for a comprehensive physical exam. The patient reports no problems. Needs med refill. Had pap last year with + HPV but negative for HPV 16,18, 45.  Social History   Social History  . Marital status: Single    Spouse name: N/A  . Number of children: N/A  . Years of education: N/A   Occupational History  . Not on file.   Social History Main Topics  . Smoking status: Current Every Day Smoker    Packs/day: 0.00    Types: Cigarettes  . Smokeless tobacco: Never Used     Comment: 1 cig/wk  . Alcohol use 0.5 oz/week    1 Standard drinks or equivalent per week  . Drug use: No  . Sexual activity: Yes    Birth control/ protection: Post-menopausal   Other Topics Concern  . Not on file   Social History Narrative  . No narrative on file   Health Maintenance  Topic Date Due  . Hepatitis C Screening  11[birth year]  . HIV Screening  06/03/1978  . TETANUS/TDAP  06/03/1982  . COLONOSCOPY  06/03/2013  . INFLUENZA VACCINE  11/28/2015  . MAMMOGRAM  10/17/2017  . PAP SMEAR  06/21/2018    The following portions of the patient's history were reviewed and updated as appropriate: allergies, current medications, past family history, past medical history, past social history, past surgical history and problem list.  Review of Systems Pertinent items noted in HPI and remainder of comprehensive ROS otherwise negative.   Objective:    BP 131/72 (BP Location: Left Arm, Patient Position: Sitting, Cuff Size: Normal)   Pulse 78   Wt 160 lb 12.8 oz (72.9 kg)   BMI 25.18 kg/m  General appearance: alert, cooperative and appears stated age Head: Normocephalic, without obvious abnormality, atraumatic Neck: no adenopathy, supple, symmetrical, trachea midline and thyroid not enlarged, symmetric, no tenderness/mass/nodules Lungs: clear to auscultation bilaterally Breasts: normal appearance, no masses or tenderness Heart: regular rate  and rhythm, S1, S2 normal, no murmur, click, rub or gallop Abdomen: soft, non-tender; bowel sounds normal; no masses,  no organomegaly Pelvic: cervix normal in appearance, external genitalia normal, no adnexal masses or tenderness, no cervical motion tenderness, uterus normal size, shape, and consistency and vagina normal without discharge Extremities: extremities normal, atraumatic, no cyanosis or edema Pulses: 2+ and symmetric Skin: Skin color, texture, turgor normal. No rashes or lesions Lymph nodes: Cervical, supraclavicular, and axillary nodes normal. Neurologic: Grossly normal    Assessment:    Healthy female exam.      Plan:      Problem List Items Addressed This Visit      Unprioritized   Insomnia   Relevant Medications   zolpidem (AMBIEN) 10 MG tablet   Anxiety   Relevant Medications   ALPRAZolam (XANAX) 0.5 MG tablet    Other Visit Diagnoses    Screening for cervical cancer    -  Primary   Relevant Orders   Cytology - PAP   Screening for malignant neoplasm of cervix       Encounter for gynecological examination with abnormal finding       Relevant Orders   CBC   TSH   Comprehensive metabolic panel   Lipid panel      See After Visit Summary for Counseling Recommendations

## 2016-07-04 LAB — LIPID PANEL
Chol/HDL Ratio: 3 ratio units (ref 0.0–4.4)
Cholesterol, Total: 167 mg/dL (ref 100–199)
HDL: 56 mg/dL (ref >39)
LDL Calculated: 82 mg/dL (ref 0–99)
Triglycerides: 145 mg/dL (ref 0–149)
VLDL Cholesterol Cal: 29 mg/dL (ref 5–40)

## 2016-07-04 LAB — CBC
Hematocrit: 42.6 % (ref 34.0–46.6)
Hemoglobin: 14.6 g/dL (ref 11.1–15.9)
MCH: 29 pg (ref 26.6–33.0)
MCHC: 34.3 g/dL (ref 31.5–35.7)
MCV: 85 fL (ref 79–97)
Platelets: 288 10*3/uL (ref 150–379)
RBC: 5.04 x10E6/uL (ref 3.77–5.28)
RDW: 13.7 % (ref 12.3–15.4)
WBC: 6.9 10*3/uL (ref 3.4–10.8)

## 2016-07-04 LAB — COMPREHENSIVE METABOLIC PANEL
A/G RATIO: 1.8 (ref 1.2–2.2)
ALBUMIN: 4.9 g/dL (ref 3.5–5.5)
ALT: 21 IU/L (ref 0–32)
AST: 16 IU/L (ref 0–40)
Alkaline Phosphatase: 86 IU/L (ref 39–117)
BILIRUBIN TOTAL: 0.4 mg/dL (ref 0.0–1.2)
BUN / CREAT RATIO: 17 (ref 9–23)
BUN: 11 mg/dL (ref 6–24)
CHLORIDE: 101 mmol/L (ref 96–106)
CO2: 22 mmol/L (ref 18–29)
Calcium: 9.6 mg/dL (ref 8.7–10.2)
Creatinine, Ser: 0.66 mg/dL (ref 0.57–1.00)
GFR calc non Af Amer: 101 mL/min/{1.73_m2} (ref >59)
GFR, EST AFRICAN AMERICAN: 117 mL/min/{1.73_m2} (ref >59)
GLOBULIN, TOTAL: 2.8 g/dL (ref 1.5–4.5)
Glucose: 88 mg/dL (ref 65–99)
POTASSIUM: 4.2 mmol/L (ref 3.5–5.2)
SODIUM: 141 mmol/L (ref 134–144)
TOTAL PROTEIN: 7.7 g/dL (ref 6.0–8.5)

## 2016-07-04 LAB — TSH: TSH: 1.37 u[IU]/mL (ref 0.450–4.500)

## 2016-07-08 LAB — CYTOLOGY - PAP
DIAGNOSIS: UNDETERMINED — AB
HPV 16/18/45 GENOTYPING: NEGATIVE
HPV: DETECTED — AB

## 2016-08-06 ENCOUNTER — Encounter: Payer: Self-pay | Admitting: Family Medicine

## 2016-08-06 ENCOUNTER — Ambulatory Visit (INDEPENDENT_AMBULATORY_CARE_PROVIDER_SITE_OTHER): Payer: BLUE CROSS/BLUE SHIELD | Admitting: Family Medicine

## 2016-08-06 DIAGNOSIS — R87810 Cervical high risk human papillomavirus (HPV) DNA test positive: Secondary | ICD-10-CM

## 2016-08-06 DIAGNOSIS — R87610 Atypical squamous cells of undetermined significance on cytologic smear of cervix (ASC-US): Secondary | ICD-10-CM

## 2016-08-06 NOTE — Addendum Note (Signed)
Addended by: [name] on: 08/06/2016 01:58 PM   Modules accepted: Level of Service

## 2016-08-06 NOTE — Progress Notes (Signed)
    GYNECOLOGY CLINIC COLPOSCOPY PROCEDURE NOTE  51 y.o. G3P3 here for colposcopy for ASCUS with POSITIVE high risk HPV pap smear on 06/21/16. Discussed role for HPV in cervical dysplasia, need for surveillance.  Patient given informed consent, signed copy in the chart, time out was performed.  Placed in lithotomy position. Cervix viewed with speculum and colposcope after application of acetic acid.   Colposcopy adequate? Yes  acetowhite lesion(s) noted at anteriorly at the [facility]  Biopsies obtained at 10 and 2 o'clock;Mar[facility] Kitchen  ECC specimen obtained. All specimens were labeled and sent to pathology.     Impression/Plan: Problem List Items Addressed This Visit      Unprioritized   ASCUS with positive high risk HPV cervical   Relevant Orders   Surgical pathology     Patient was given post procedure instructions.  Will follow up pathology and manage accordingly.  Routine preventative health maintenance measures emphasized.  Brittney Howell 08/06/2016 1:53 PM

## 2016-08-06 NOTE — Patient Instructions (Signed)

## 2016-08-12 ENCOUNTER — Telehealth: Payer: Self-pay | Admitting: *Deleted

## 2016-08-12 NOTE — Telephone Encounter (Signed)
Informed pt of result and recommendation to repeat pap in one year, pt acknowledged instructions.

## 2016-08-12 NOTE — Telephone Encounter (Signed)
-----   Message from [name], MD sent at 08/10/2016  8:36 PM EDT ----- Her biopsy was negative--repeat pap in 1 year.

## 2016-11-11 ENCOUNTER — Other Ambulatory Visit: Payer: Self-pay | Admitting: Family Medicine

## 2016-11-11 DIAGNOSIS — Z1231 Encounter for screening mammogram for malignant neoplasm of breast: Secondary | ICD-10-CM

## 2016-11-20 ENCOUNTER — Ambulatory Visit: Payer: BLUE CROSS/BLUE SHIELD

## 2016-11-21 ENCOUNTER — Ambulatory Visit
Admission: RE | Admit: 2016-11-21 | Discharge: 2016-11-21 | Disposition: A | Discharge status: Home / Self Care | Payer: BLUE CROSS/BLUE SHIELD | Source: Ambulatory Visit | Attending: Family Medicine | Admitting: Family Medicine

## 2016-11-21 DIAGNOSIS — Z1231 Encounter for screening mammogram for malignant neoplasm of breast: Secondary | ICD-10-CM

## 2017-05-15 ENCOUNTER — Encounter: Payer: Self-pay | Admitting: Radiology

## 2017-06-10 ENCOUNTER — Encounter: Payer: Self-pay | Admitting: Family Medicine

## 2017-06-10 ENCOUNTER — Ambulatory Visit: Payer: BLUE CROSS/BLUE SHIELD | Admitting: Family Medicine

## 2017-06-10 DIAGNOSIS — N6019 Diffuse cystic mastopathy of unspecified breast: Secondary | ICD-10-CM | POA: Insufficient documentation

## 2017-06-10 DIAGNOSIS — N6011 Diffuse cystic mastopathy of right breast: Secondary | ICD-10-CM | POA: Diagnosis not present

## 2017-06-10 NOTE — Progress Notes (Signed)
Check knot on breast

## 2017-06-10 NOTE — Patient Instructions (Signed)
Fibrocystic Breast Changes  Fibrocystic breast changes are changes that can make your breasts swollen or painful. These changes happen when tiny sacs of fluid (cysts) form in the breast. This is a common condition. It does not mean that you have cancer. It usually happens because of hormone changes during a monthly period.  Follow these instructions at home:   Check your breasts after every monthly period. If you do not have monthly periods, check your breasts on the first day of every month. Check for:  ? Soreness.  ? New swelling or puffiness.  ? A change in breast size.  ? A change in a lump that was already there.   Take over-the-counter and prescription medicines only as told by your doctor.   Wear a support or sports bra that fits well. Wear this support especially when you are exercising.   Avoid or have less caffeine, fat, and sugar in what you eat and drink as told by your doctor.  Contact a doctor if:   You have fluid coming from your nipple, especially if the fluid has blood in it.   You have new lumps or bumps in your breast.   Your breast gets puffy, red, and painful.   You have changes in how your breast looks.   Your nipple looks flat or it sinks into your breast.  Get help right away if:   Your breast turns red, and the redness is spreading.  Summary   Fibrocystic breast changes are changes that can make your breasts swollen or painful.   This condition can happen when you have hormone changes during your monthly period.   With this condition, it is important to check your breasts after every monthly period. If you do not have monthly periods, check your breasts on the first day of every month.  This information is not intended to replace advice given to you by your health care provider. Make sure you discuss any questions you have with your health care provider.  Document Released: 03/28/2008 Document Revised: 12/28/2015 Document Reviewed: 12/28/2015  Elsevier Interactive Patient  Education  2017 Elsevier Inc.

## 2017-06-10 NOTE — Assessment & Plan Note (Signed)
If changes, return--o/w f/u mammogram screening in 10/2017

## 2017-06-10 NOTE — Progress Notes (Signed)
   Subjective:    Patient ID: Brittney Howell is a 52 y.o. female presenting with [place]  on 06/10/2017  HPI: Has found a knot x 1 year. Negative mammogram. Has not changed size. No pain, or skin changes. Has h/o fibrocystic change. Normal mammogram 10/2016   Review of Systems  Constitutional: Negative for chills and fever.  Respiratory: Negative for shortness of breath.   Cardiovascular: Negative for chest pain.  Gastrointestinal: Negative for abdominal pain, nausea and vomiting.  Genitourinary: Negative for dysuria.  Skin: Negative for rash.      Objective:    BP (!) 151/77   Pulse 72   Wt 168 lb 6.4 oz (76.4 kg)   BMI 26.38 kg/m  Physical Exam  Constitutional: She is oriented to person, place, and time. She appears well-developed and well-nourished. No distress.  HENT:  Head: Normocephalic and atraumatic.  Eyes: No scleral icterus.  Neck: Neck supple.  Cardiovascular: Normal rate.  Pulmonary/Chest: Effort normal. Right breast exhibits no inverted nipple, no mass, no nipple discharge, no skin change and no tenderness. Left breast exhibits no inverted nipple, no mass, no nipple discharge, no skin change and no tenderness. Breasts are symmetrical.    Abdominal: Soft.  Neurological: She is alert and oriented to person, place, and time.  Skin: Skin is warm and dry.  Psychiatric: She has a normal mood and affect.        Assessment & Plan:   Problem List Items Addressed This Visit      Unprioritized   Fibrocystic breast    If changes, return--o/w f/u mammogram screening in 10/2017         Total face-to-face time with patient: 10 minutes. Over 50% of encounter was spent on counseling and coordination of care.  [name] 06/10/2017 11:42 AM

## 2017-07-10 ENCOUNTER — Ambulatory Visit (INDEPENDENT_AMBULATORY_CARE_PROVIDER_SITE_OTHER): Payer: BLUE CROSS/BLUE SHIELD | Admitting: Family Medicine

## 2017-07-10 ENCOUNTER — Encounter: Payer: Self-pay | Admitting: Family Medicine

## 2017-07-10 VITALS — BP 116/70 | HR 72 | Wt 163.1 lb

## 2017-07-10 DIAGNOSIS — Z1151 Encounter for screening for human papillomavirus (HPV): Secondary | ICD-10-CM

## 2017-07-10 DIAGNOSIS — Z124 Encounter for screening for malignant neoplasm of cervix: Secondary | ICD-10-CM | POA: Diagnosis not present

## 2017-07-10 DIAGNOSIS — Z01419 Encounter for gynecological examination (general) (routine) without abnormal findings: Secondary | ICD-10-CM

## 2017-07-10 DIAGNOSIS — R87610 Atypical squamous cells of undetermined significance on cytologic smear of cervix (ASC-US): Secondary | ICD-10-CM

## 2017-07-10 DIAGNOSIS — R87810 Cervical high risk human papillomavirus (HPV) DNA test positive: Secondary | ICD-10-CM

## 2017-07-10 DIAGNOSIS — F419 Anxiety disorder, unspecified: Secondary | ICD-10-CM

## 2017-07-10 DIAGNOSIS — G4709 Other insomnia: Secondary | ICD-10-CM

## 2017-07-10 DIAGNOSIS — Z720 Tobacco use: Secondary | ICD-10-CM

## 2017-07-10 MED ORDER — ZOLPIDEM TARTRATE 10 MG PO TABS: 5.0000 mg | ORAL_TABLET | Freq: Every evening | ORAL | 3 refills | Status: DC | PRN

## 2017-07-10 MED ORDER — ALPRAZOLAM 0.5 MG PO TABS: 0.5000 mg | ORAL_TABLET | Freq: Every evening | ORAL | 2 refills | Status: DC | PRN

## 2017-07-10 NOTE — Assessment & Plan Note (Signed)
Uses Xanax sparingly and not daily. One Rx lasts entire year.

## 2017-07-10 NOTE — Assessment & Plan Note (Signed)
Refilled Ambien 

## 2017-07-10 NOTE — Assessment & Plan Note (Signed)
Repeat pap testing this year.

## 2017-07-10 NOTE — Assessment & Plan Note (Signed)
Thinks she has a 20+ pack year history--consider LDCT if age and pack years indicate the need. (30 pack year hx and age > 70)

## 2017-07-10 NOTE — Patient Instructions (Signed)
Preventive Care 40-64 Years, Female Preventive care refers to lifestyle choices and visits with your health care provider that can promote health and wellness. What does preventive care include?  A yearly physical exam. This is also called an annual well check.  Dental exams once or twice a year.  Routine eye exams. Ask your health care provider how often you should have your eyes checked.  Personal lifestyle choices, including: ? Daily care of your teeth and gums. ? Regular physical activity. ? Eating a healthy diet. ? Avoiding tobacco and drug use. ? Limiting alcohol use. ? Practicing safe sex. ? Taking low-dose aspirin daily starting at age 58. ? Taking vitamin and mineral supplements as recommended by your health care provider. What happens during an annual well check? The services and screenings done by your health care provider during your annual well check will depend on your age, overall health, lifestyle risk factors, and family history of disease. Counseling Your health care provider may ask you questions about your:  Alcohol use.  Tobacco use.  Drug use.  Emotional well-being.  Home and relationship well-being.  Sexual activity.  Eating habits.  Work and work Statistician.  Method of birth control.  Menstrual cycle.  Pregnancy history.  Screening You may have the following tests or measurements:  Height, weight, and BMI.  Blood pressure.  Lipid and cholesterol levels. These may be checked every 5 years, or more frequently if you are over 81 years old.  Skin check.  Lung cancer screening. You may have this screening every year starting at age 78 if you have a 30-pack-year history of smoking and currently smoke or have quit within the past 15 years.  Fecal occult blood test (FOBT) of the stool. You may have this test every year starting at age 65.  Flexible sigmoidoscopy or colonoscopy. You may have a sigmoidoscopy every 5 years or a colonoscopy  every 10 years starting at age 30.  Hepatitis C blood test.  Hepatitis B blood test.  Sexually transmitted disease (STD) testing.  Diabetes screening. This is done by checking your blood sugar (glucose) after you have not eaten for a while (fasting). You may have this done every 1-3 years.  Mammogram. This may be done every 1-2 years. Talk to your health care provider about when you should start having regular mammograms. This may depend on whether you have a family history of breast cancer.  BRCA-related cancer screening. This may be done if you have a family history of breast, ovarian, tubal, or peritoneal cancers.  Pelvic exam and Pap test. This may be done every 3 years starting at age 80. Starting at age 36, this may be done every 5 years if you have a Pap test in combination with an HPV test.  Bone density scan. This is done to screen for osteoporosis. You may have this scan if you are at high risk for osteoporosis.  Discuss your test results, treatment options, and if necessary, the need for more tests with your health care provider. Vaccines Your health care provider may recommend certain vaccines, such as:  Influenza vaccine. This is recommended every year.  Tetanus, diphtheria, and acellular pertussis (Tdap, Td) vaccine. You may need a Td booster every 10 years.  Varicella vaccine. You may need this if you have not been vaccinated.  Zoster vaccine. You may need this after age 5.  Measles, mumps, and rubella (MMR) vaccine. You may need at least one dose of MMR if you were born in  1957 or later. You may also need a second dose.  Pneumococcal 13-valent conjugate (PCV13) vaccine. You may need this if you have certain conditions and were not previously vaccinated.  Pneumococcal polysaccharide (PPSV23) vaccine. You may need one or two doses if you smoke cigarettes or if you have certain conditions.  Meningococcal vaccine. You may need this if you have certain  conditions.  Hepatitis A vaccine. You may need this if you have certain conditions or if you travel or work in places where you may be exposed to hepatitis A.  Hepatitis B vaccine. You may need this if you have certain conditions or if you travel or work in places where you may be exposed to hepatitis B.  Haemophilus influenzae type b (Hib) vaccine. You may need this if you have certain conditions.  Talk to your health care provider about which screenings and vaccines you need and how often you need them. This information is not intended to replace advice given to you by your health care provider. Make sure you discuss any questions you have with your health care provider. Document Released: 05/12/2015 Document Revised: 01/03/2016 Document Reviewed: 02/14/2015 Elsevier Interactive Patient Education  2018 Elsevier Inc.  

## 2017-07-10 NOTE — Progress Notes (Signed)
Subjective:     Brittney Howell is a 52 y.o. female and is here for a comprehensive physical exam. The patient reports no problems.  Social History   Socioeconomic History  . Marital status: Single    Spouse name: Not on file  . Number of children: Not on file  . Years of education: Not on file  . Highest education level: Not on file  Social Needs  . Financial resource strain: Not on file  . Food insecurity - worry: Not on file  . Food insecurity - inability: Not on file  . Transportation needs - medical: Not on file  . Transportation needs - non-medical: Not on file  Occupational History  . Not on file  Tobacco Use  . Smoking status: Current Some Day Smoker    Packs/day: 0.00    Types: Cigarettes  . Smokeless tobacco: Never Used  . Tobacco comment: 1 cig/wk  Substance and Sexual Activity  . Alcohol use: Yes    Alcohol/week: 0.5 oz    Types: 1 Standard drinks or equivalent per week  . Drug use: No  . Sexual activity: Yes    Birth control/protection: Post-menopausal  Other Topics Concern  . Not on file  Social History Narrative  . Not on file   Health Maintenance  Topic Date Due  . Hepatitis C Screening  11/01/1965  . HIV Screening  06/03/1978  . TETANUS/TDAP  06/03/1982  . COLONOSCOPY  06/03/2013  . INFLUENZA VACCINE  11/27/2016  . MAMMOGRAM  11/22/2018  . PAP SMEAR  07/04/2019    The following portions of the patient's history were reviewed and updated as appropriate: allergies, current medications, past family history, past medical history, past social history, past surgical history and problem list.  Review of Systems Pertinent items noted in HPI and remainder of comprehensive ROS otherwise negative.   Objective:    BP 116/70   Pulse 72   Wt 163 lb 1.6 oz (74 kg)   BMI 25.55 kg/m  General appearance: alert, cooperative and appears stated age Head: Normocephalic, without obvious abnormality, atraumatic Neck: no adenopathy, supple, symmetrical,  trachea midline and thyroid not enlarged, symmetric, no tenderness/mass/nodules Lungs: clear to auscultation bilaterally Breasts: normal appearance, no masses or tenderness Heart: regular rate and rhythm, S1, S2 normal, no murmur, click, rub or gallop Abdomen: soft, non-tender; bowel sounds normal; no masses,  no organomegaly Pelvic: cervix normal in appearance, external genitalia normal, no adnexal masses or tenderness, no cervical motion tenderness, uterus normal size, shape, and consistency and vagina normal without discharge Extremities: extremities normal, atraumatic, no cyanosis or edema Pulses: 2+ and symmetric Skin: Skin color, texture, turgor normal. No rashes or lesions Lymph nodes: Cervical, supraclavicular, and axillary nodes normal. Neurologic: Grossly normal    Assessment:    Healthy female exam.      Plan:      Problem List Items Addressed This Visit      Unprioritized   Tobacco use    Thinks she has a 20+ pack year history--consider LDCT if age and pack years indicate the need. (30 pack year hx and age > 71)      Insomnia    Refilled Ambien      Relevant Medications   zolpidem (AMBIEN) 10 MG tablet   Anxiety    Uses Xanax sparingly and not daily. One Rx lasts entire year.      Relevant Medications   ALPRAZolam (XANAX) 0.5 MG tablet   ASCUS with positive high risk HPV  cervical    Repeat pap testing this year.       Other Visit Diagnoses    Well woman exam with routine gynecological exam    -  Primary   Relevant Orders   Ambulatory referral to Gastroenterology   Cytology - PAP   CBC   TSH   Hemoglobin A1c   Comprehensive metabolic panel   Lipid panel   Screening for malignant neoplasm of cervix         Return in 1 year (on 07/11/2018).  See After Visit Summary for Counseling Recommendations

## 2017-07-11 ENCOUNTER — Encounter: Payer: Self-pay | Admitting: Family Medicine

## 2017-07-11 LAB — COMPREHENSIVE METABOLIC PANEL
ALK PHOS: 97 IU/L (ref 39–117)
ALT: 22 IU/L (ref 0–32)
AST: 16 IU/L (ref 0–40)
Albumin/Globulin Ratio: 1.8 (ref 1.2–2.2)
Albumin: 5 g/dL (ref 3.5–5.5)
BUN/Creatinine Ratio: 17 (ref 9–23)
BUN: 13 mg/dL (ref 6–24)
Bilirubin Total: 0.3 mg/dL (ref 0.0–1.2)
CO2: 19 mmol/L — ABNORMAL LOW (ref 20–29)
Calcium: 9.9 mg/dL (ref 8.7–10.2)
Chloride: 108 mmol/L — ABNORMAL HIGH (ref 96–106)
Creatinine, Ser: 0.76 mg/dL (ref 0.57–1.00)
GFR calc Af Amer: 103 mL/min/{1.73_m2} (ref >59)
GFR, EST NON AFRICAN AMERICAN: 89 mL/min/{1.73_m2} (ref >59)
GLOBULIN, TOTAL: 2.8 g/dL (ref 1.5–4.5)
GLUCOSE: 90 mg/dL (ref 65–99)
POTASSIUM: 4.4 mmol/L (ref 3.5–5.2)
Sodium: 145 mmol/L — ABNORMAL HIGH (ref 134–144)
TOTAL PROTEIN: 7.8 g/dL (ref 6.0–8.5)

## 2017-07-11 LAB — LIPID PANEL
CHOLESTEROL TOTAL: 183 mg/dL (ref 100–199)
Chol/HDL Ratio: 3.7 ratio (ref 0.0–4.4)
HDL: 50 mg/dL (ref >39)
LDL Calculated: 100 mg/dL — ABNORMAL HIGH (ref 0–99)
TRIGLYCERIDES: 166 mg/dL — AB (ref 0–149)
VLDL Cholesterol Cal: 33 mg/dL (ref 5–40)

## 2017-07-11 LAB — CBC
HEMATOCRIT: 44.4 % (ref 34.0–46.6)
HEMOGLOBIN: 14.7 g/dL (ref 11.1–15.9)
MCH: 28.9 pg (ref 26.6–33.0)
MCHC: 33.1 g/dL (ref 31.5–35.7)
MCV: 87 fL (ref 79–97)
Platelets: 277 10*3/uL (ref 150–379)
RBC: 5.09 x10E6/uL (ref 3.77–5.28)
RDW: 13.7 % (ref 12.3–15.4)
WBC: 6.4 10*3/uL (ref 3.4–10.8)

## 2017-07-11 LAB — HEMOGLOBIN A1C
Est. average glucose Bld gHb Est-mCnc: 108 mg/dL
Hgb A1c MFr Bld: 5.4 % (ref 4.8–5.6)

## 2017-07-11 LAB — TSH: TSH: 1.37 u[IU]/mL (ref 0.450–4.500)

## 2017-07-11 NOTE — Progress Notes (Signed)
Please print result letter and mail to patient.

## 2017-07-11 NOTE — Progress Notes (Signed)
Mailed today

## 2017-07-14 LAB — CYTOLOGY - PAP
Adequacy: ABSENT
Diagnosis: NEGATIVE
HPV: NOT DETECTED

## 2017-07-15 ENCOUNTER — Encounter: Payer: Self-pay | Admitting: *Deleted

## 2017-10-22 ENCOUNTER — Other Ambulatory Visit: Payer: Self-pay

## 2017-10-22 ENCOUNTER — Ambulatory Visit (AMBULATORY_SURGERY_CENTER): Payer: Self-pay

## 2017-10-22 VITALS — Ht 66.0 in | Wt 164.6 lb

## 2017-10-22 DIAGNOSIS — Z1211 Encounter for screening for malignant neoplasm of colon: Secondary | ICD-10-CM

## 2017-10-22 MED ORDER — NA SULFATE-K SULFATE-MG SULF 17.5-3.13-1.6 GM/177ML PO SOLN: 1.0000 | Freq: Once | ORAL | 0 refills | Status: AC

## 2017-10-22 NOTE — Progress Notes (Signed)
Denies allergies to eggs or soy products. Denies complication of anesthesia or sedation. Denies use of weight loss medication. Denies use of O2.   Emmi instructions declined.  

## 2017-10-24 ENCOUNTER — Encounter: Payer: Self-pay | Admitting: Internal Medicine

## 2017-11-07 ENCOUNTER — Encounter: Payer: Self-pay | Admitting: Internal Medicine

## 2017-11-07 ENCOUNTER — Ambulatory Visit (AMBULATORY_SURGERY_CENTER): Payer: BLUE CROSS/BLUE SHIELD | Admitting: Internal Medicine

## 2017-11-07 VITALS — BP 103/72 | HR 66 | Temp 97.8°F | Resp 20 | Ht 66.0 in | Wt 164.0 lb

## 2017-11-07 DIAGNOSIS — K635 Polyp of colon: Secondary | ICD-10-CM | POA: Diagnosis not present

## 2017-11-07 DIAGNOSIS — Z1211 Encounter for screening for malignant neoplasm of colon: Secondary | ICD-10-CM

## 2017-11-07 DIAGNOSIS — D120 Benign neoplasm of cecum: Secondary | ICD-10-CM

## 2017-11-07 DIAGNOSIS — D125 Benign neoplasm of sigmoid colon: Secondary | ICD-10-CM

## 2017-11-07 MED ORDER — SODIUM CHLORIDE 0.9 % IV SOLN: 500.0000 mL | Freq: Once | INTRAVENOUS | Status: AC

## 2017-11-07 NOTE — Progress Notes (Signed)
Called to room to assist during endoscopic procedure.  Patient ID and intended procedure confirmed with present staff. Received instructions for my participation in the procedure from the performing physician.  

## 2017-11-07 NOTE — Progress Notes (Signed)
Pt's states no medical or surgical changes since previsit or office visit. 

## 2017-11-07 NOTE — Op Note (Signed)
[facility] Patient Name: Brittney Howell Procedure Date: 11/07/2017 2:11 PM MRN: [id number] Endoscopist: Jerene Bears , MD Age: 52 Referring MD:  Date of Birth: [birth year] Gender: Female Account #: [id number] Procedure:                Colonoscopy Indications:              Screening for colorectal malignant neoplasm, This                            is the patient's first colonoscopy Medicines:                Monitored Anesthesia Care Procedure:                Pre-Anesthesia Assessment:                           - Prior to the procedure, a History and Physical                            was performed, and patient medications and                            allergies were reviewed. The patient's tolerance of                            previous anesthesia was also reviewed. The risks                            and benefits of the procedure and the sedation                            options and risks were discussed with the patient.                            All questions were answered, and informed consent                            was obtained. Prior Anticoagulants: The patient has                            taken no previous anticoagulant or antiplatelet                            agents. ASA Grade Assessment: II - A patient with                            mild systemic disease. After reviewing the risks                            and benefits, the patient was deemed in                            satisfactory condition to undergo the procedure.  After obtaining informed consent, the colonoscope                            was passed under direct vision. Throughout the                            procedure, the patient's blood pressure, pulse, and                            oxygen saturations were monitored continuously. The                            Colonoscope was introduced through the anus and                            advanced to the cecum,  identified by appendiceal                            orifice and ileocecal valve. The colonoscopy was                            performed without difficulty. The patient tolerated                            the procedure well. The quality of the bowel                            preparation was good after copious irrigation and                            lavage. The ileocecal valve, appendiceal orifice,                            and rectum were photographed. Scope In: 2:27:12 PM Scope Out: 2:44:05 PM Scope Withdrawal Time: 0 hours 13 minutes 3 seconds  Total Procedure Duration: 0 hours 16 minutes 53 seconds  Findings:                 The digital rectal exam was normal.                           A 5 mm polyp was found in the cecum. The polyp was                            sessile. The polyp was removed with a cold snare.                            Resection and retrieval were complete.                           Two sessile polyps were found in the sigmoid colon.                            The polyps were 5 to  6 mm in size. These polyps                            were removed with a cold snare. Resection and                            retrieval were complete.                           Internal hemorrhoids were found during                            retroflexion. The hemorrhoids were small. Complications:            No immediate complications. Estimated Blood Loss:     Estimated blood loss was minimal. Impression:               - One 5 mm polyp in the cecum, removed with a cold                            snare. Resected and retrieved.                           - Two 5 to 6 mm polyps in the sigmoid colon,                            removed with a cold snare. Resected and retrieved.                           - Internal hemorrhoids. Recommendation:           - Patient has a contact number available for                            emergencies. The signs and symptoms of potential                             delayed complications were discussed with the                            patient. Return to normal activities tomorrow.                            Written discharge instructions were provided to the                            patient.                           - Resume previous diet.                           - Continue present medications.                           - Await pathology results.                           -  Repeat colonoscopy is recommended. The                            colonoscopy date will be determined after pathology                            results from today's exam become available for                            review. Jerene Bears, MD 11/07/2017 2:49:21 PM This report has been signed electronically.

## 2017-11-07 NOTE — Progress Notes (Signed)
Alert and oriented x3, pleased with MAC, report to RN  

## 2017-11-07 NOTE — Patient Instructions (Signed)
Handouts given on polyps and hemorrhoids   YOU HAD AN ENDOSCOPIC PROCEDURE TODAY AT THE [facility]:   Refer to the procedure report that was given to you for any specific questions about what was found during the examination.  If the procedure report does not answer your questions, please call your gastroenterologist to clarify.  If you requested that your care partner not be given the details of your procedure findings, then the procedure report has been included in a sealed envelope for you to review at your convenience later.  YOU SHOULD EXPECT: Some feelings of bloating in the abdomen. Passage of more gas than usual.  Walking can help get rid of the air that was put into your GI tract during the procedure and reduce the bloating. If you had a lower endoscopy (such as a colonoscopy or flexible sigmoidoscopy) you may notice spotting of blood in your stool or on the toilet paper. If you underwent a bowel prep for your procedure, you may not have a normal bowel movement for a few days.  Please Note:  You might notice some irritation and congestion in your nose or some drainage.  This is from the oxygen used during your procedure.  There is no need for concern and it should clear up in a day or so.  SYMPTOMS TO REPORT IMMEDIATELY:   Following lower endoscopy (colonoscopy or flexible sigmoidoscopy):  Excessive amounts of blood in the stool  Significant tenderness or worsening of abdominal pains  Swelling of the abdomen that is new, acute  Fever of 100F or higher    For urgent or emergent issues, a gastroenterologist can be reached at any hour by calling [phone].   DIET:  We do recommend a small meal at first, but then you may proceed to your regular diet.  Drink plenty of fluids but you should avoid alcoholic beverages for 24 hours.  ACTIVITY:  You should plan to take it easy for the rest of today and you should NOT DRIVE or use heavy machinery until tomorrow (because of  the sedation medicines used during the test).    FOLLOW UP: Our staff will call the number listed on your records the next business day following your procedure to check on you and address any questions or concerns that you may have regarding the information given to you following your procedure. If we do not reach you, we will leave a message.  However, if you are feeling well and you are not experiencing any problems, there is no need to return our call.  We will assume that you have returned to your regular daily activities without incident.  If any biopsies were taken you will be contacted by phone or by letter within the next 1-3 weeks.  Please call us at [phone] if you have not heard about the biopsies in 3 weeks.    SIGNATURES/CONFIDENTIALITY: You and/or your care partner have signed paperwork which will be entered into your electronic medical record.  These signatures attest to the fact that that the information above on your After Visit Summary has been reviewed and is understood.  Full responsibility of the confidentiality of this discharge information lies with you and/or your care-partner. 

## 2017-11-10 ENCOUNTER — Telehealth: Payer: Self-pay | Admitting: *Deleted

## 2017-11-10 NOTE — Telephone Encounter (Signed)
  Follow up Call-  Call back number 11/07/2017  Post procedure Call Back phone  # [phone]  Permission to leave phone message Yes  Some recent data might be hidden     Patient questions:  Do you have a fever, pain , or abdominal swelling? No. Pain Score  0 *  Have you tolerated food without any problems? Yes.    Have you been able to return to your normal activities? Yes.    Do you have any questions about your discharge instructions: Diet   No. Medications  No. Follow up visit  No.  Do you have questions or concerns about your Care? No.  Actions: * If pain score is 4 or above: No action needed, pain <4.

## 2017-11-10 NOTE — Telephone Encounter (Signed)
  Follow up Call-  Call back number 11/07/2017  Post procedure Call Back phone  # [phone]  Permission to leave phone message Yes  Some recent data might be hidden     No answer at # given.  LM on VM.

## 2017-11-14 ENCOUNTER — Encounter: Payer: Self-pay | Admitting: Internal Medicine

## 2017-12-26 ENCOUNTER — Other Ambulatory Visit: Payer: Self-pay | Admitting: Family Medicine

## 2017-12-26 DIAGNOSIS — Z1231 Encounter for screening mammogram for malignant neoplasm of breast: Secondary | ICD-10-CM

## 2018-01-30 ENCOUNTER — Ambulatory Visit
Admission: RE | Admit: 2018-01-30 | Discharge: 2018-01-30 | Disposition: A | Discharge status: Home / Self Care | Payer: BLUE CROSS/BLUE SHIELD | Source: Ambulatory Visit | Attending: Family Medicine | Admitting: Family Medicine

## 2018-01-30 DIAGNOSIS — Z1231 Encounter for screening mammogram for malignant neoplasm of breast: Secondary | ICD-10-CM

## 2018-03-23 ENCOUNTER — Other Ambulatory Visit: Payer: Self-pay | Admitting: Family Medicine

## 2018-03-23 DIAGNOSIS — G4709 Other insomnia: Secondary | ICD-10-CM

## 2018-03-23 MED ORDER — ZOLPIDEM TARTRATE 10 MG PO TABS: 5.0000 mg | ORAL_TABLET | Freq: Every evening | ORAL | 3 refills | Status: DC | PRN

## 2018-05-12 ENCOUNTER — Encounter: Payer: Self-pay | Admitting: Radiology

## 2018-07-14 ENCOUNTER — Ambulatory Visit (INDEPENDENT_AMBULATORY_CARE_PROVIDER_SITE_OTHER): Payer: BLUE CROSS/BLUE SHIELD | Admitting: Family Medicine

## 2018-07-14 ENCOUNTER — Encounter: Payer: Self-pay | Admitting: Family Medicine

## 2018-07-14 ENCOUNTER — Other Ambulatory Visit: Payer: Self-pay

## 2018-07-14 VITALS — BP 107/69 | HR 72 | Wt 149.4 lb

## 2018-07-14 DIAGNOSIS — Z1151 Encounter for screening for human papillomavirus (HPV): Secondary | ICD-10-CM

## 2018-07-14 DIAGNOSIS — Z124 Encounter for screening for malignant neoplasm of cervix: Secondary | ICD-10-CM

## 2018-07-14 DIAGNOSIS — Z01419 Encounter for gynecological examination (general) (routine) without abnormal findings: Secondary | ICD-10-CM | POA: Diagnosis not present

## 2018-07-14 DIAGNOSIS — F419 Anxiety disorder, unspecified: Secondary | ICD-10-CM

## 2018-07-14 DIAGNOSIS — Z122 Encounter for screening for malignant neoplasm of respiratory organs: Secondary | ICD-10-CM

## 2018-07-14 DIAGNOSIS — G4709 Other insomnia: Secondary | ICD-10-CM

## 2018-07-14 DIAGNOSIS — E559 Vitamin D deficiency, unspecified: Secondary | ICD-10-CM

## 2018-07-14 DIAGNOSIS — J3089 Other allergic rhinitis: Secondary | ICD-10-CM

## 2018-07-14 MED ORDER — IBUPROFEN 600 MG PO TABS: 600.0000 mg | ORAL_TABLET | Freq: Four times a day (QID) | ORAL | 3 refills | Status: AC | PRN

## 2018-07-14 MED ORDER — FLUTICASONE PROPIONATE 50 MCG/ACT NA SUSP: 2.0000 | Freq: Every day | NASAL | 2 refills | Status: DC

## 2018-07-14 MED ORDER — CALCIUM + D3 600-200 MG-UNIT PO TABS: 1.0000 | ORAL_TABLET | Freq: Every day | ORAL | 3 refills | Status: AC

## 2018-07-14 MED ORDER — ZOLPIDEM TARTRATE 10 MG PO TABS: 5.0000 mg | ORAL_TABLET | Freq: Every evening | ORAL | 3 refills | Status: DC | PRN

## 2018-07-14 MED ORDER — ALPRAZOLAM 0.5 MG PO TABS: 0.5000 mg | ORAL_TABLET | Freq: Every evening | ORAL | 2 refills | Status: DC | PRN

## 2018-07-14 NOTE — Progress Notes (Signed)
  Mammogram 01/30/2018  March 2019-normal pap   No STI

## 2018-07-14 NOTE — Progress Notes (Signed)
  Subjective:     Brittney Howell is a 53 y.o. female and is here for a comprehensive physical exam. The patient reports no problems.   The following portions of the patient's history were reviewed and updated as appropriate: allergies, current medications, past family history, past medical history, past social history, past surgical history and problem list.  Review of Systems Pertinent items noted in HPI and remainder of comprehensive ROS otherwise negative.   Objective:    BP 107/69   Pulse 72   Wt 149 lb 6.4 oz (67.8 kg)   BMI 24.11 kg/m  General appearance: alert, cooperative and appears stated age Head: Normocephalic, without obvious abnormality, atraumatic Neck: no adenopathy, supple, symmetrical, trachea midline and thyroid not enlarged, symmetric, no tenderness/mass/nodules Lungs: clear to auscultation bilaterally Breasts: normal appearance, no masses or tenderness Heart: regular rate and rhythm, S1, S2 normal, no murmur, click, rub or gallop Abdomen: soft, non-tender; bowel sounds normal; no masses,  no organomegaly Pelvic: cervix normal in appearance, external genitalia normal, no adnexal masses or tenderness, no cervical motion tenderness, uterus normal size, shape, and consistency, vagina normal without discharge and large birthmark on right gluteal fold, unchanged per patient. Extremities: Homans sign is negative, no sign of DVT Pulses: 2+ and symmetric Skin: Skin color, texture, turgor normal. No rashes or lesions Lymph nodes: Cervical, supraclavicular, and axillary nodes normal. Neurologic: Grossly normal    Assessment:    Healthy female exam.      Plan:   Problem List Items Addressed This Visit      Unprioritized   Insomnia   Relevant Medications   zolpidem (AMBIEN) 10 MG tablet   Anxiety   Relevant Medications   ALPRAZolam (XANAX) 0.5 MG tablet    Other Visit Diagnoses    Non-seasonal allergic rhinitis due to other allergic trigger    -  Primary   Relevant Medications   fluticasone (FLONASE ALLERGY RELIEF) 50 MCG/ACT nasal spray   Screening for malignant neoplasm of cervix       Relevant Orders   Cytology - PAP( [place])   Encounter for gynecological examination without abnormal finding       Relevant Orders   CBC   Comprehensive metabolic panel   TSH   Hemoglobin A1c   Lipid panel   VITAMIN D 25 Hydroxy (Vit-D Deficiency, Fractures)   MM DIGITAL SCREENING BILATERAL   Encounter for screening for malignant neoplasm of respiratory organs       Relevant Orders   CT CHEST LUNG CANCER SCREENING LOW DOSE WO CONTRAST     Return in 1 year (on 07/14/2019).    See After Visit Summary for Counseling Recommendations

## 2018-07-15 ENCOUNTER — Telehealth: Payer: Self-pay | Admitting: *Deleted

## 2018-07-15 LAB — LIPID PANEL
Chol/HDL Ratio: 3.5 ratio (ref 0.0–4.4)
Cholesterol, Total: 164 mg/dL (ref 100–199)
HDL: 47 mg/dL (ref >39)
LDL Calculated: 89 mg/dL (ref 0–99)
Triglycerides: 141 mg/dL (ref 0–149)
VLDL Cholesterol Cal: 28 mg/dL (ref 5–40)

## 2018-07-15 LAB — COMPREHENSIVE METABOLIC PANEL
ALBUMIN: 4.6 g/dL (ref 3.8–4.9)
ALK PHOS: 89 IU/L (ref 39–117)
ALT: 17 IU/L (ref 0–32)
AST: 16 IU/L (ref 0–40)
Albumin/Globulin Ratio: 2 (ref 1.2–2.2)
BILIRUBIN TOTAL: 0.3 mg/dL (ref 0.0–1.2)
BUN/Creatinine Ratio: 17 (ref 9–23)
BUN: 12 mg/dL (ref 6–24)
CO2: 20 mmol/L (ref 20–29)
Calcium: 9.6 mg/dL (ref 8.7–10.2)
Chloride: 105 mmol/L (ref 96–106)
Creatinine, Ser: 0.7 mg/dL (ref 0.57–1.00)
GFR calc Af Amer: 113 mL/min/{1.73_m2} (ref >59)
GFR, EST NON AFRICAN AMERICAN: 98 mL/min/{1.73_m2} (ref >59)
GLUCOSE: 80 mg/dL (ref 65–99)
Globulin, Total: 2.3 g/dL (ref 1.5–4.5)
Potassium: 4.2 mmol/L (ref 3.5–5.2)
Sodium: 141 mmol/L (ref 134–144)
Total Protein: 6.9 g/dL (ref 6.0–8.5)

## 2018-07-15 LAB — CBC
HEMOGLOBIN: 14.8 g/dL (ref 11.1–15.9)
Hematocrit: 43 % (ref 34.0–46.6)
MCH: 29.3 pg (ref 26.6–33.0)
MCHC: 34.4 g/dL (ref 31.5–35.7)
MCV: 85 fL (ref 79–97)
Platelets: 270 10*3/uL (ref 150–450)
RBC: 5.05 x10E6/uL (ref 3.77–5.28)
RDW: 13 % (ref 11.7–15.4)
WBC: 6.7 10*3/uL (ref 3.4–10.8)

## 2018-07-15 LAB — CYTOLOGY - PAP
Diagnosis: NEGATIVE
HPV: NOT DETECTED

## 2018-07-15 LAB — HEMOGLOBIN A1C
ESTIMATED AVERAGE GLUCOSE: 108 mg/dL
HEMOGLOBIN A1C: 5.4 % (ref 4.8–5.6)

## 2018-07-15 LAB — TSH: TSH: 1.34 u[IU]/mL (ref 0.450–4.500)

## 2018-07-15 LAB — VITAMIN D 25 HYDROXY (VIT D DEFICIENCY, FRACTURES): Vit D, 25-Hydroxy: 19 ng/mL — ABNORMAL LOW (ref 30.0–100.0)

## 2018-07-15 MED ORDER — VITAMIN D (ERGOCALCIFEROL) 1.25 MG (50000 UNIT) PO CAPS: 50000.0000 [IU] | ORAL_CAPSULE | ORAL | 0 refills | Status: AC

## 2018-07-15 NOTE — Addendum Note (Signed)
Addended by: [name] on: 07/15/2018 08:47 AM   Modules accepted: Orders

## 2018-07-15 NOTE — Telephone Encounter (Signed)
Pt informed of lab results and RX sent in.

## 2019-02-03 ENCOUNTER — Other Ambulatory Visit: Payer: Self-pay

## 2019-02-03 ENCOUNTER — Ambulatory Visit: Payer: BLUE CROSS/BLUE SHIELD

## 2019-02-03 ENCOUNTER — Ambulatory Visit
Admission: RE | Admit: 2019-02-03 | Discharge: 2019-02-03 | Disposition: A | Discharge status: Home / Self Care | Payer: BLUE CROSS/BLUE SHIELD | Source: Ambulatory Visit | Attending: Family Medicine | Admitting: Family Medicine

## 2019-02-03 DIAGNOSIS — Z01419 Encounter for gynecological examination (general) (routine) without abnormal findings: Secondary | ICD-10-CM

## 2019-03-23 ENCOUNTER — Other Ambulatory Visit: Payer: Self-pay | Admitting: Family Medicine

## 2019-03-23 DIAGNOSIS — G4709 Other insomnia: Secondary | ICD-10-CM

## 2019-05-20 ENCOUNTER — Encounter: Payer: Self-pay | Admitting: Radiology

## 2019-07-22 ENCOUNTER — Ambulatory Visit (INDEPENDENT_AMBULATORY_CARE_PROVIDER_SITE_OTHER): Payer: BLUE CROSS/BLUE SHIELD | Admitting: Family Medicine

## 2019-07-22 ENCOUNTER — Encounter: Payer: Self-pay | Admitting: Family Medicine

## 2019-07-22 ENCOUNTER — Other Ambulatory Visit: Payer: Self-pay

## 2019-07-22 DIAGNOSIS — Z1272 Encounter for screening for malignant neoplasm of vagina: Secondary | ICD-10-CM

## 2019-07-22 DIAGNOSIS — J3089 Other allergic rhinitis: Secondary | ICD-10-CM

## 2019-07-22 DIAGNOSIS — Z124 Encounter for screening for malignant neoplasm of cervix: Secondary | ICD-10-CM | POA: Diagnosis not present

## 2019-07-22 DIAGNOSIS — G4709 Other insomnia: Secondary | ICD-10-CM | POA: Diagnosis not present

## 2019-07-22 DIAGNOSIS — Z1151 Encounter for screening for human papillomavirus (HPV): Secondary | ICD-10-CM

## 2019-07-22 DIAGNOSIS — Z01419 Encounter for gynecological examination (general) (routine) without abnormal findings: Secondary | ICD-10-CM

## 2019-07-22 DIAGNOSIS — Z720 Tobacco use: Secondary | ICD-10-CM

## 2019-07-22 DIAGNOSIS — F419 Anxiety disorder, unspecified: Secondary | ICD-10-CM

## 2019-07-22 MED ORDER — ALPRAZOLAM 0.5 MG PO TABS: 0.5000 mg | ORAL_TABLET | Freq: Every evening | ORAL | 2 refills | Status: DC | PRN

## 2019-07-22 MED ORDER — FLUTICASONE PROPIONATE 50 MCG/ACT NA SUSP: 2.0000 | Freq: Every day | NASAL | 2 refills | Status: AC

## 2019-07-22 MED ORDER — ZOLPIDEM TARTRATE ER 12.5 MG PO TBCR: 12.5000 mg | EXTENDED_RELEASE_TABLET | Freq: Every day | ORAL | 1 refills | Status: DC

## 2019-07-22 NOTE — Patient Instructions (Signed)

## 2019-07-22 NOTE — Assessment & Plan Note (Signed)
Needs LDCT--awaiting insurance coverage.

## 2019-07-22 NOTE — Assessment & Plan Note (Signed)
Uses one rx for the year--uses sparingly

## 2019-07-22 NOTE — Progress Notes (Signed)
Last mammogram 02/03/2019 -negative

## 2019-07-22 NOTE — Progress Notes (Signed)
  Subjective:     Brittney Howell is a 54 y.o. female and is here for a comprehensive physical exam. The patient reports no problems. Still smoking. She is working for [employer] and has pulled a muscle in her neck. Using heat.  The following portions of the patient's history were reviewed and updated as appropriate: allergies, current medications, past family history, past medical history, past social history, past surgical history and problem list.  Review of Systems Pertinent items noted in HPI and remainder of comprehensive ROS otherwise negative.   Objective:    BP 119/71   Pulse 71   Wt 169 lb (76.7 kg)   BMI 27.28 kg/m  General appearance: alert, cooperative and appears stated age Head: Normocephalic, without obvious abnormality, atraumatic Neck: no adenopathy, supple, symmetrical, trachea midline and thyroid not enlarged, symmetric, no tenderness/mass/nodules Lungs: clear to auscultation bilaterally Breasts: normal appearance, no masses or tenderness Heart: regular rate and rhythm, S1, S2 normal, no murmur, click, rub or gallop Abdomen: soft, non-tender; bowel sounds normal; no masses,  no organomegaly Pelvic: cervix normal in appearance, external genitalia normal, no adnexal masses or tenderness, no cervical motion tenderness, uterus normal size, shape, and consistency and vagina normal without discharge Extremities: extremities normal, atraumatic, no cyanosis or edema Pulses: 2+ and symmetric Skin: Skin color, texture, turgor normal. No rashes or lesions Lymph nodes: Cervical, supraclavicular, and axillary nodes normal. Neurologic: Grossly normal    Assessment:    Healthy female exam.      Plan:      Problem List Items Addressed This Visit      Unprioritized   Tobacco use    Needs LDCT--awaiting insurance coverage.      Insomnia    Changed to CR per insurance      Relevant Medications   zolpidem (AMBIEN CR) 12.5 MG CR tablet   Anxiety    Uses one rx for the  year--uses sparingly      Relevant Medications   ALPRAZolam (XANAX) 0.5 MG tablet    Other Visit Diagnoses    Screening for malignant neoplasm of cervix       Relevant Orders   Cytology - PAP( [facility])   Encounter for gynecological examination without abnormal finding       Relevant Orders   TSH   Hemoglobin A1c   CBC   Comprehensive metabolic panel   VITAMIN D 25 Hydroxy (Vit-D Deficiency, Fractures)   Lipid panel   Non-seasonal allergic rhinitis due to other allergic trigger       continue Flonase prn   Relevant Medications   fluticasone (FLONASE ALLERGY RELIEF) 50 MCG/ACT nasal spray     Return in 1 year (on 07/21/2020).  See After Visit Summary for Counseling Recommendations

## 2019-07-22 NOTE — Assessment & Plan Note (Signed)
Changed to CR per insurance

## 2019-07-22 NOTE — Progress Notes (Signed)
Pt states she has a pinched nerve in her upper right back

## 2019-07-23 ENCOUNTER — Telehealth: Payer: Self-pay | Admitting: *Deleted

## 2019-07-23 LAB — COMPREHENSIVE METABOLIC PANEL
ALT: 63 IU/L — ABNORMAL HIGH (ref 0–32)
AST: 35 IU/L (ref 0–40)
Albumin/Globulin Ratio: 2.1 (ref 1.2–2.2)
Albumin: 4.8 g/dL (ref 3.8–4.9)
Alkaline Phosphatase: 111 IU/L (ref 39–117)
BUN/Creatinine Ratio: 15 (ref 9–23)
BUN: 11 mg/dL (ref 6–24)
Bilirubin Total: 0.3 mg/dL (ref 0.0–1.2)
CO2: 25 mmol/L (ref 20–29)
Calcium: 9.9 mg/dL (ref 8.7–10.2)
Chloride: 103 mmol/L (ref 96–106)
Creatinine, Ser: 0.71 mg/dL (ref 0.57–1.00)
GFR calc Af Amer: 110 mL/min/{1.73_m2} (ref >59)
GFR calc non Af Amer: 96 mL/min/{1.73_m2} (ref >59)
Globulin, Total: 2.3 g/dL (ref 1.5–4.5)
Glucose: 77 mg/dL (ref 65–99)
Potassium: 4.4 mmol/L (ref 3.5–5.2)
Sodium: 140 mmol/L (ref 134–144)
Total Protein: 7.1 g/dL (ref 6.0–8.5)

## 2019-07-23 LAB — CBC
Hematocrit: 43.3 % (ref 34.0–46.6)
Hemoglobin: 15 g/dL (ref 11.1–15.9)
MCH: 29.8 pg (ref 26.6–33.0)
MCHC: 34.6 g/dL (ref 31.5–35.7)
MCV: 86 fL (ref 79–97)
Platelets: 251 10*3/uL (ref 150–450)
RBC: 5.04 x10E6/uL (ref 3.77–5.28)
RDW: 13 % (ref 11.7–15.4)
WBC: 7 10*3/uL (ref 3.4–10.8)

## 2019-07-23 LAB — LIPID PANEL
Chol/HDL Ratio: 4.6 ratio — ABNORMAL HIGH (ref 0.0–4.4)
Cholesterol, Total: 215 mg/dL — ABNORMAL HIGH (ref 100–199)
HDL: 47 mg/dL (ref >39)
LDL Chol Calc (NIH): 137 mg/dL — ABNORMAL HIGH (ref 0–99)
Triglycerides: 172 mg/dL — ABNORMAL HIGH (ref 0–149)
VLDL Cholesterol Cal: 31 mg/dL (ref 5–40)

## 2019-07-23 LAB — HEMOGLOBIN A1C
Est. average glucose Bld gHb Est-mCnc: 111 mg/dL
Hgb A1c MFr Bld: 5.5 % (ref 4.8–5.6)

## 2019-07-23 LAB — CYTOLOGY - PAP
Comment: NEGATIVE
Diagnosis: NEGATIVE
High risk HPV: NEGATIVE

## 2019-07-23 LAB — TSH: TSH: 1.68 u[IU]/mL (ref 0.450–4.500)

## 2019-07-23 LAB — VITAMIN D 25 HYDROXY (VIT D DEFICIENCY, FRACTURES): Vit D, 25-Hydroxy: 13.8 ng/mL — ABNORMAL LOW (ref 30.0–100.0)

## 2019-07-23 NOTE — Telephone Encounter (Signed)
-----   Message from [name], MD sent at 07/23/2019  7:53 AM EDT ----- Her vitamin D is low--one of her liver tests is abnormal. We should repeat that in 3 months. Her cholesterol was high-->was she fasting? If so--just give her a low chol diet, if not, then no worries.

## 2019-07-23 NOTE — Telephone Encounter (Signed)
Pt informed of lab results and recommendations from Dr Kennon Rounds. Pt will come back in 3 months for a repeat lab draw.

## 2019-11-02 ENCOUNTER — Other Ambulatory Visit: Payer: Self-pay

## 2019-11-02 ENCOUNTER — Other Ambulatory Visit: Payer: BLUE CROSS/BLUE SHIELD

## 2019-11-02 DIAGNOSIS — R7989 Other specified abnormal findings of blood chemistry: Secondary | ICD-10-CM

## 2019-11-02 DIAGNOSIS — E7800 Pure hypercholesterolemia, unspecified: Secondary | ICD-10-CM

## 2019-11-02 DIAGNOSIS — E559 Vitamin D deficiency, unspecified: Secondary | ICD-10-CM

## 2019-11-02 NOTE — Progress Notes (Unsigned)
For Repeat labs

## 2019-11-03 LAB — COMPREHENSIVE METABOLIC PANEL
ALT: 21 IU/L (ref 0–32)
AST: 18 IU/L (ref 0–40)
Albumin/Globulin Ratio: 1.8 (ref 1.2–2.2)
Albumin: 4.8 g/dL (ref 3.8–4.9)
Alkaline Phosphatase: 114 IU/L (ref 48–121)
BUN/Creatinine Ratio: 15 (ref 9–23)
BUN: 11 mg/dL (ref 6–24)
Bilirubin Total: 0.4 mg/dL (ref 0.0–1.2)
CO2: 24 mmol/L (ref 20–29)
Calcium: 10.1 mg/dL (ref 8.7–10.2)
Chloride: 102 mmol/L (ref 96–106)
Creatinine, Ser: 0.75 mg/dL (ref 0.57–1.00)
GFR calc Af Amer: 103 mL/min/{1.73_m2} (ref >59)
GFR calc non Af Amer: 89 mL/min/{1.73_m2} (ref >59)
Globulin, Total: 2.6 g/dL (ref 1.5–4.5)
Glucose: 81 mg/dL (ref 65–99)
Potassium: 4.1 mmol/L (ref 3.5–5.2)
Sodium: 139 mmol/L (ref 134–144)
Total Protein: 7.4 g/dL (ref 6.0–8.5)

## 2019-11-03 LAB — LDL CHOLESTEROL, DIRECT: LDL Direct: 144 mg/dL — ABNORMAL HIGH (ref 0–99)

## 2019-11-03 LAB — VITAMIN D 25 HYDROXY (VIT D DEFICIENCY, FRACTURES): Vit D, 25-Hydroxy: 25.9 ng/mL — ABNORMAL LOW (ref 30.0–100.0)

## 2019-11-03 NOTE — Progress Notes (Signed)
Call patient to inform her of test result (LDL/liver function results) Patient voice understanding.

## 2019-12-20 ENCOUNTER — Other Ambulatory Visit: Payer: Self-pay | Admitting: Family Medicine

## 2019-12-20 DIAGNOSIS — F419 Anxiety disorder, unspecified: Secondary | ICD-10-CM

## 2019-12-20 DIAGNOSIS — G4709 Other insomnia: Secondary | ICD-10-CM

## 2019-12-20 MED ORDER — ZOLPIDEM TARTRATE ER 12.5 MG PO TBCR: 12.5000 mg | EXTENDED_RELEASE_TABLET | Freq: Every day | ORAL | 1 refills | Status: DC

## 2019-12-20 MED ORDER — ALPRAZOLAM 0.5 MG PO TABS: 0.5000 mg | ORAL_TABLET | Freq: Every evening | ORAL | 2 refills | Status: AC | PRN

## 2020-04-18 ENCOUNTER — Telehealth: Payer: Self-pay | Admitting: Radiology

## 2020-04-18 DIAGNOSIS — G4709 Other insomnia: Secondary | ICD-10-CM

## 2020-04-18 MED ORDER — ZOLPIDEM TARTRATE ER 12.5 MG PO TBCR: 12.5000 mg | EXTENDED_RELEASE_TABLET | Freq: Every day | ORAL | 1 refills | Status: AC

## 2020-04-18 NOTE — Telephone Encounter (Signed)
Med rx sent in

## 2021-08-06 IMAGING — MG MM DIGITAL SCREENING BILAT W/ CAD
4 series · 4 of 4 positions shown · non-contrast
Comparison: Previous exam(s).

CLINICAL DATA: Screening.

EXAM:
DIGITAL SCREENING BILATERAL MAMMOGRAM WITH CAD

[R CC]
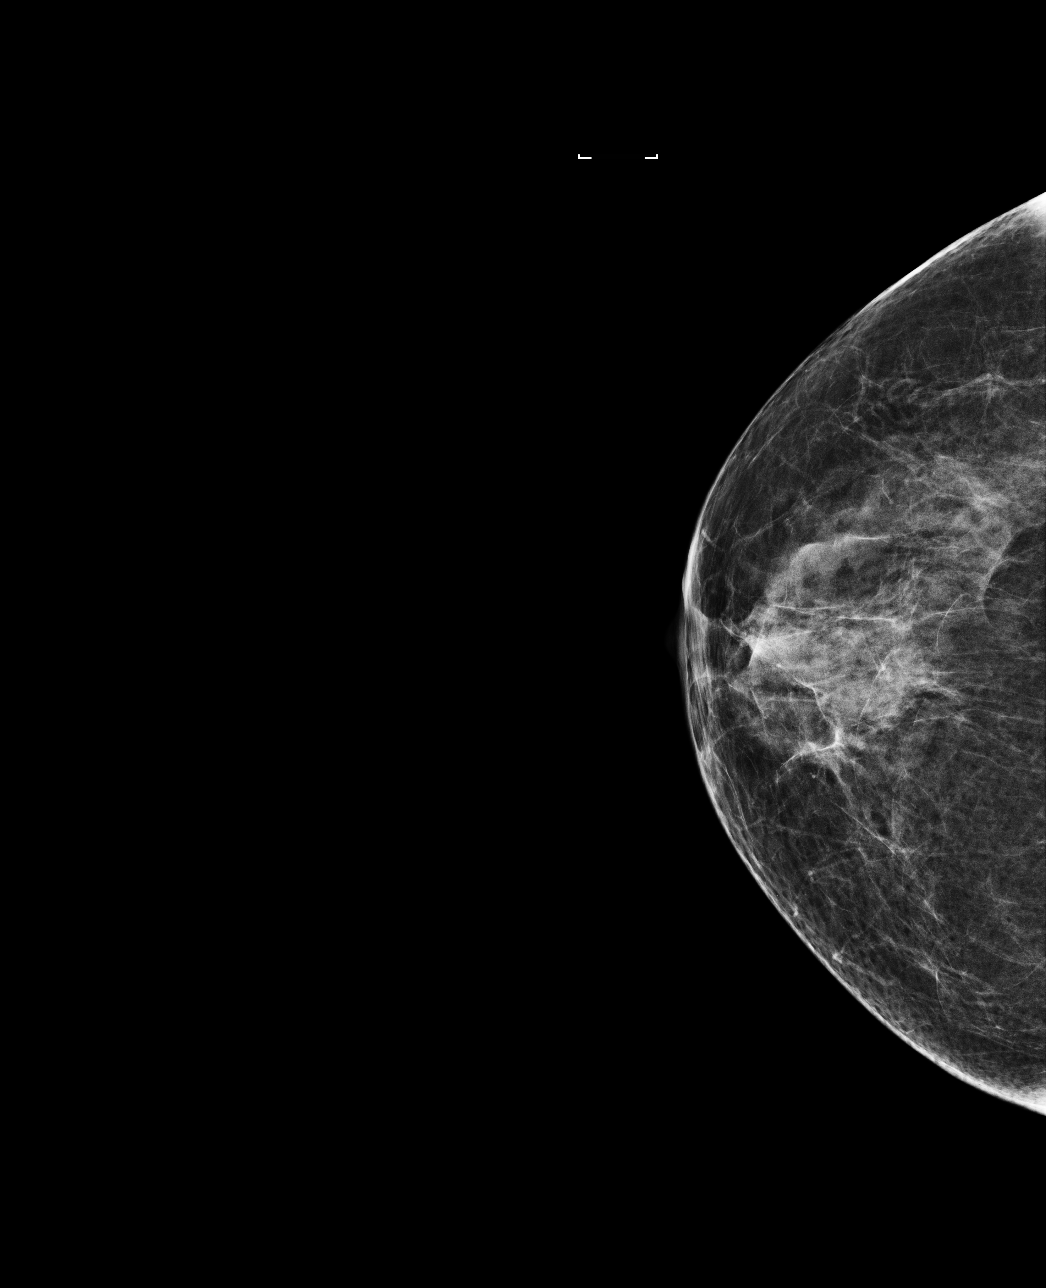

[R MLO]
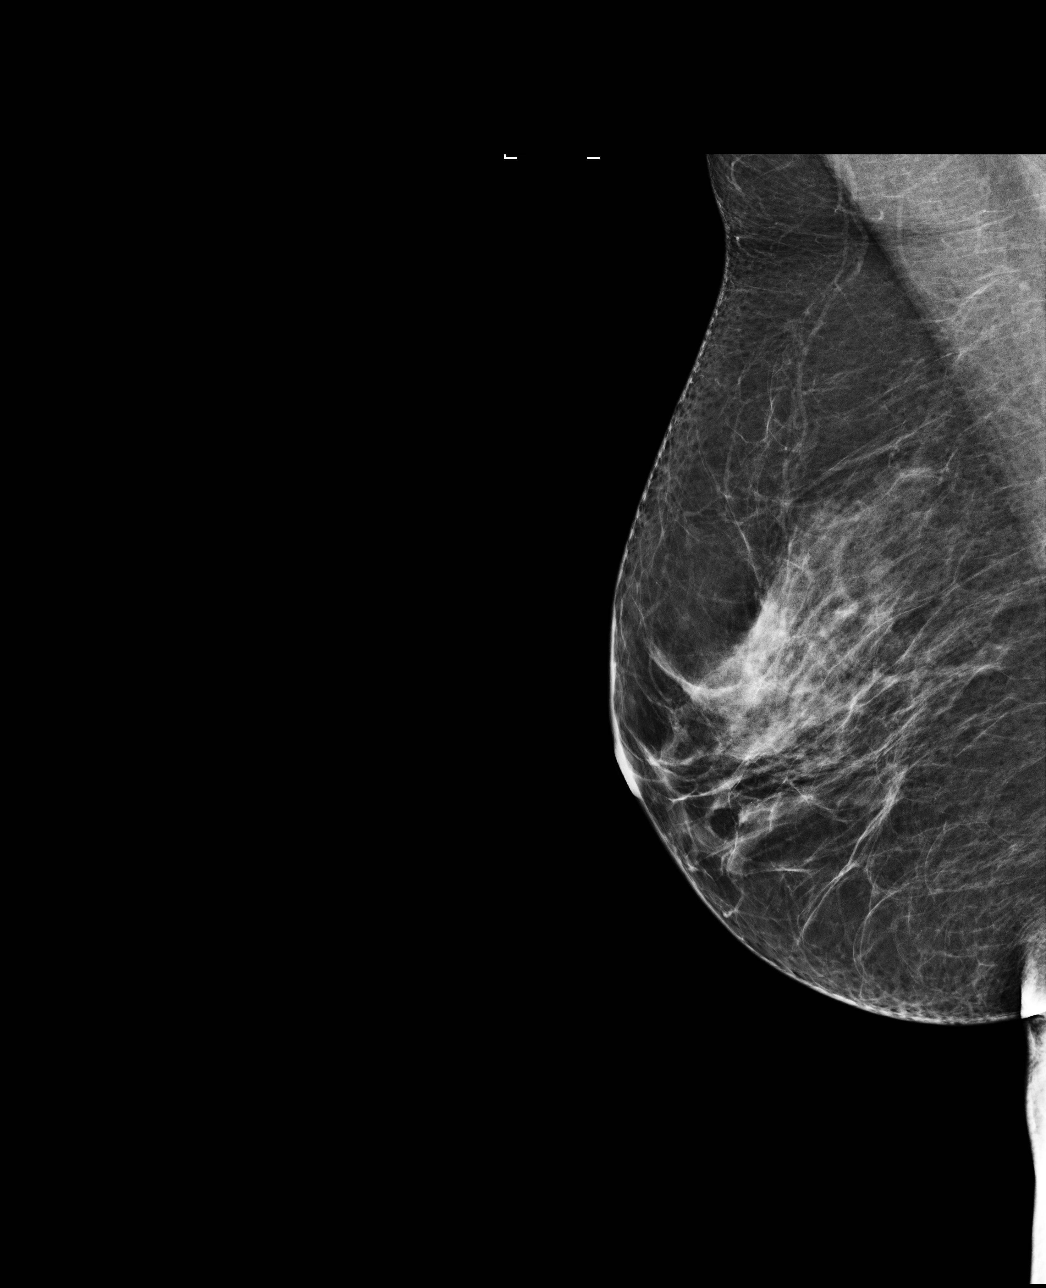

[L MLO]
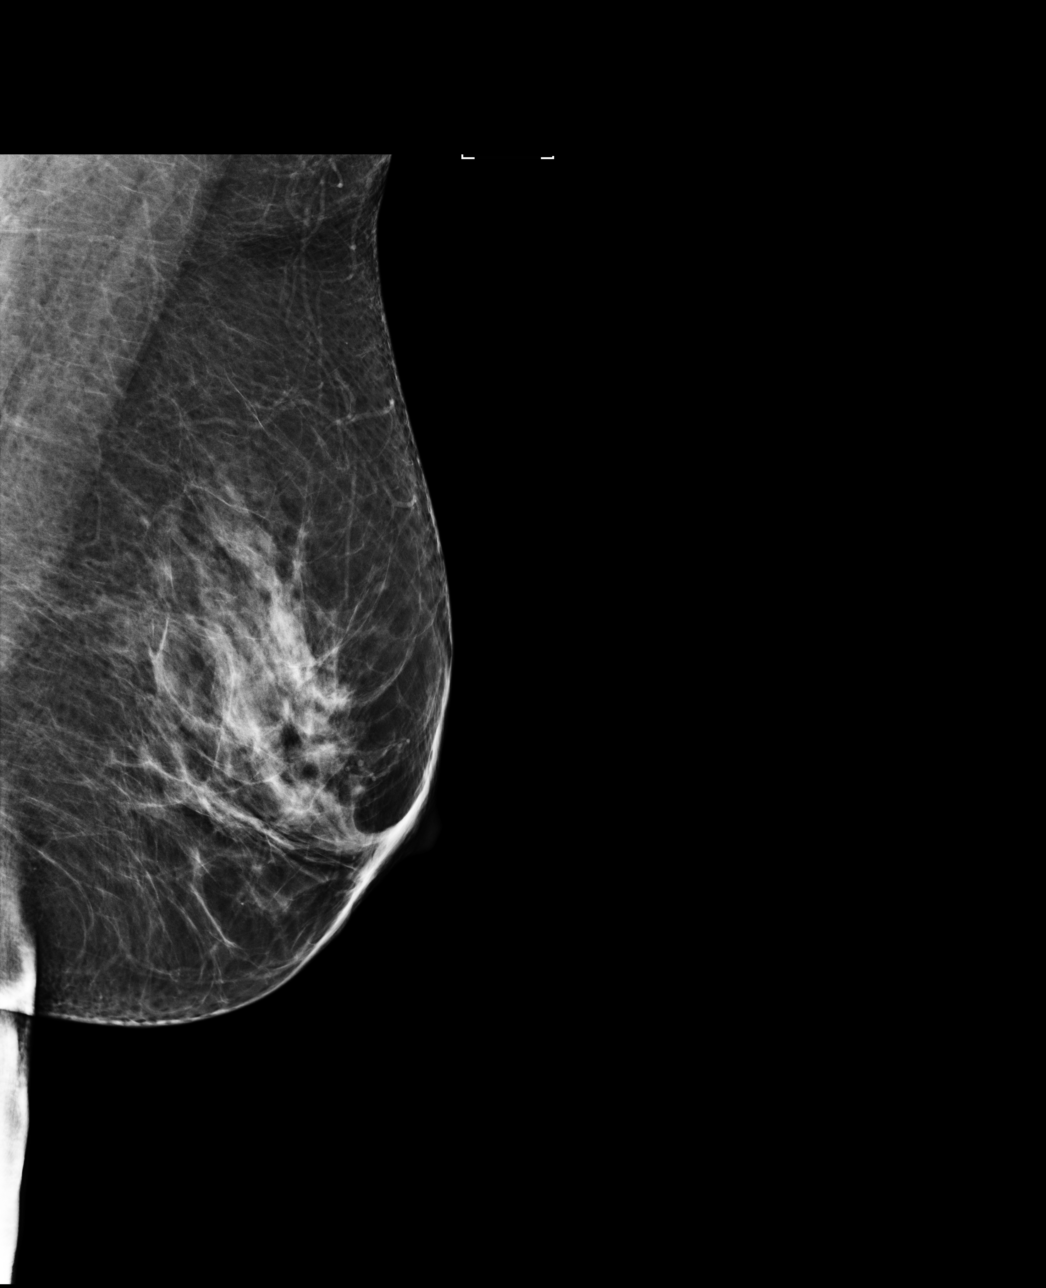

[L CC]
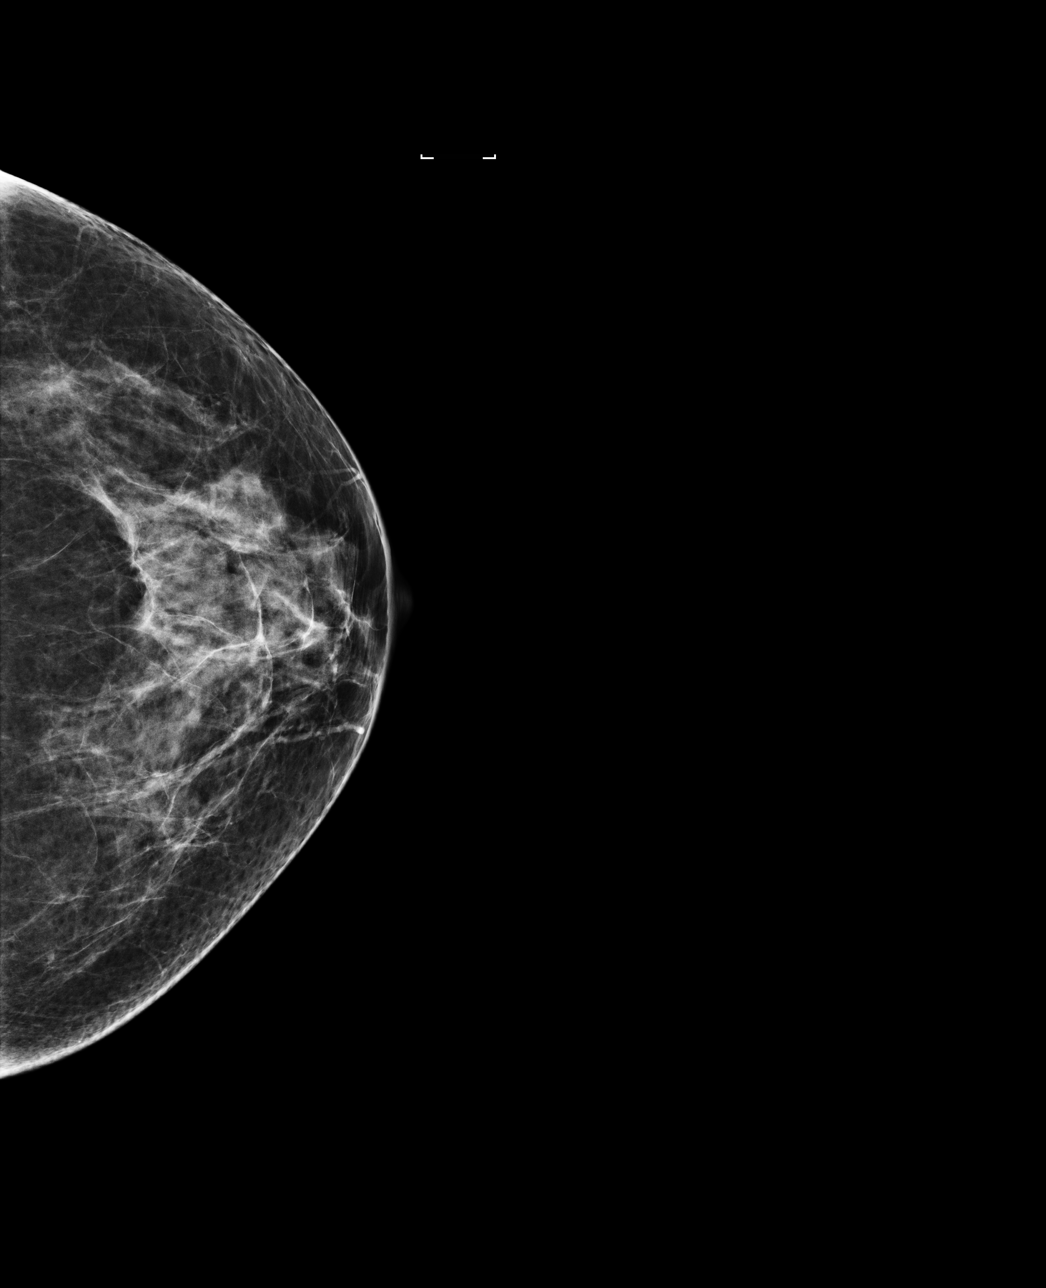

[4 of 4 positions shown; findings below may reference images not displayed]

ACR Breast Density Category c: The breast tissue is heterogeneously
dense, which may obscure small masses.
FINDINGS: There are no findings suspicious for malignancy. Images were
processed with CAD.
IMPRESSION: No mammographic evidence of malignancy. A result letter of this
screening mammogram will be mailed directly to the patient.

RECOMMENDATION:
Screening mammogram in one year. (Code:YJ-[id number])

BI-RADS CATEGORY  1: Negative.
